# Patient Record
Sex: Male | Born: 1960 | Race: White | Hispanic: No | Marital: Single | Smoking: Current every day smoker
Health system: Southern US, Community
[De-identification: ages and names within clinical notes are randomized; demographics above are authoritative.]

## PROBLEM LIST (undated history)

## (undated) DIAGNOSIS — F1010 Alcohol abuse, uncomplicated: Secondary | ICD-10-CM

## (undated) DIAGNOSIS — R569 Unspecified convulsions: Secondary | ICD-10-CM

## (undated) DIAGNOSIS — S069XAA Unspecified intracranial injury with loss of consciousness status unknown, initial encounter: Secondary | ICD-10-CM

## (undated) DIAGNOSIS — I639 Cerebral infarction, unspecified: Secondary | ICD-10-CM

## (undated) DIAGNOSIS — S069X9A Unspecified intracranial injury with loss of consciousness of unspecified duration, initial encounter: Secondary | ICD-10-CM

## (undated) HISTORY — PX: OTHER SURGICAL HISTORY: SHX169

## (undated) HISTORY — PX: NO PAST SURGERIES: SHX2092

---

## 2007-09-04 LAB — CBC WITH AUTO DIFFERENTIAL
Basophils %: 0 % (ref 0–2)
Basophils, Absolute: 0 10*3/uL (ref 0–0.2)
Eosinophils %: 3 % (ref 0–7)
Eosinophils, Absolute: 0.2 10*3/uL (ref 0–0.7)
HCT: 45.9 % (ref 42.0–54.0)
Hgb: 16 gm/dL (ref 14.0–18.0)
Lymphocytes %: 38 % (ref 25–45)
Lymphocytes, Absolute: 3.3 10*3/uL (ref 1.1–4.3)
MCH: 34.6 pg — ABNORMAL HIGH (ref 27.0–34.0)
MCHC: 34.9 gm/dL (ref 32.0–36.0)
MCV: 99.3 fL — ABNORMAL HIGH (ref 81.0–99.0)
MPV: 7.8 fL (ref 7.4–10.4)
Monocytes %: 11 % (ref 0–12)
Monocytes, Absolute: 0.9 10*3/uL (ref 0–1.2)
Neutrophils, Absolute: 4.1 10*3/uL (ref 1.6–7.3)
Platelet Count: 225 10*3/uL (ref 150–400)
RBC: 4.63 10*6/uL — ABNORMAL LOW (ref 4.70–6.10)
RDW: 13.6 % (ref 11.5–14.5)
Segs (Neutrophils)%: 48 % (ref 35–70)
WBC: 8.5 10*3/uL (ref 4.8–10.8)

## 2007-09-04 LAB — BASIC METABOLIC PANEL
Anion Gap: 13.6 mmol/L (ref 5–19)
BUN: 8 mg/dL (ref 6–26)
CO2 - Carbon Dioxide: 27.4 mmol/L (ref 22–34)
Calcium: 8.7 mg/dL (ref 8.5–10.5)
Chloride: 93 mmol/L — ABNORMAL LOW (ref 99–111)
Creatinine, Serum: 0.8 mg/dL (ref 0.6–1.3)
GFR Estimate: >60 mL/min/{1.73_m2} (ref >60)
Glucose: 91 mg/dL (ref 80–99)
Potassium: 4 mmol/L (ref 3.5–5.3)
Sodium: 130 mmol/L — ABNORMAL LOW (ref 136–148)

## 2007-09-04 LAB — ALCOHOL, SERUM: Alcohol Scrn: 319 mg/dL — ABNORMAL HIGH (ref <5)

## 2010-05-01 LAB — COMPREHENSIVE METABOLIC PANEL
ALT - Alanine Amino transferase: 38 IU/L (ref 13–60)
AST - Aspartate Aminotransferase: 55 IU/L — ABNORMAL HIGH (ref 10–42)
Albumin/Globulin Ratio: 1.1 (ref >0.9)
Albumin: 3.6 gm/dL (ref 3.5–5.0)
Alkaline Phosphatase: 73 IU/L (ref 37–107)
Anion Gap: 14 mmol/L — ABNORMAL HIGH (ref 3–11)
BUN: 8 mg/dL (ref 8–21)
Bilirubin, Total: 0.7 mg/dL (ref 0.2–1.4)
CO2 - Carbon Dioxide: 23 mmol/L (ref 22–31)
Calcium: 9.3 mg/dL (ref 8.6–10.0)
Chloride: 96 mmol/L — ABNORMAL LOW (ref 98–111)
Creatinine, Serum: 1.11 mg/dL (ref 0.64–1.27)
GFR Estimate: >60 mL/min/{1.73_m2} (ref >60)
Globulin: 3.4 gm/dL (ref 2.2–3.7)
Glucose: 122 mg/dL — ABNORMAL HIGH (ref 80–99)
Potassium: 4.1 mmol/L (ref 3.5–5.1)
Protein, Total: 7 gm/dL (ref 6.1–7.9)
Sodium: 133 mmol/L — ABNORMAL LOW (ref 135–143)

## 2010-05-01 LAB — CBC WITH AUTO DIFFERENTIAL
Basophils %: 1 % (ref 0–2)
Basophils, Absolute: 0 10*3/uL (ref 0–0.2)
Eosinophils %: 4 % (ref 0–7)
Eosinophils, Absolute: 0.3 10*3/uL (ref 0–0.7)
HCT: 44.1 % (ref 42.0–54.0)
Hgb: 15.5 gm/dL (ref 12.0–18.0)
Lymphocytes %: 23 % — ABNORMAL LOW (ref 25–45)
Lymphocytes, Absolute: 1.7 10*3/uL (ref 1.1–4.3)
MCH: 35.8 pg — ABNORMAL HIGH (ref 27–34)
MCHC: 35 gm/dL (ref 32–36)
MCV: 102.3 fL — ABNORMAL HIGH (ref 81–99)
MPV: 8.6 fL (ref 7.4–10.4)
Monocytes %: 11 % (ref 0–12)
Monocytes, Absolute: 0.8 10*3/uL (ref 0–1.2)
Neutrophils, Absolute: 4.6 10*3/uL (ref 1.6–7.3)
Platelet Count: 229 10*3/uL (ref 150–400)
RBC: 4.32 10*6/uL — ABNORMAL LOW (ref 4.70–6.10)
RDW: 13.5 % (ref 11.5–14.5)
Segs (Neutrophils)%: 61 % (ref 35–70)
WBC: 7.4 10*3/uL (ref 4.8–10.8)

## 2010-05-01 LAB — LAMOTRIGINE LEVEL: Lamotrigine Level: 4.8 ug/mL (ref 2.5–15.0)

## 2012-02-03 ENCOUNTER — Inpatient Hospital Stay: Payer: MEDICARE

## 2012-02-03 DIAGNOSIS — L02229 Furuncle of trunk, unspecified: Secondary | ICD-10-CM

## 2012-02-03 LAB — WOUND CULTURE, SUPERFICIAL
Gram Stain Result: NONE SEEN
Gram Stain Result: NONE SEEN

## 2013-06-02 ENCOUNTER — Inpatient Hospital Stay: Admit: 2013-06-02 | Discharge: 2013-06-02 | Disposition: A | Discharge status: Home / Self Care | Payer: MEDICARE

## 2013-06-02 DIAGNOSIS — G40909 Epilepsy, unspecified, not intractable, without status epilepticus: Secondary | ICD-10-CM

## 2013-06-02 LAB — CBC WITH AUTO DIFFERENTIAL
Basophils %: 1 % (ref 0–2)
Basophils, Absolute: 0.1 10*3/uL (ref 0–0.2)
Eosinophils %: 1 % (ref 0–7)
Eosinophils, Absolute: 0.1 10*3/uL (ref 0–0.7)
HCT: 47.1 % (ref 42.0–54.0)
Hgb: 16.2 gm/dL (ref 14.0–18.0)
Lymphocytes %: 36 % (ref 25–45)
Lymphocytes, Absolute: 2.8 10*3/uL (ref 1.1–4.3)
MCH: 35.1 pg — ABNORMAL HIGH (ref 27.0–34.0)
MCHC: 34.4 gm/dL (ref 32.0–36.0)
MCV: 102 fL — ABNORMAL HIGH (ref 81.0–99.0)
MPV: 8.7 fL (ref 7.4–10.4)
Monocytes %: 12 % (ref 0–12)
Monocytes, Absolute: 1 10*3/uL (ref 0–1.2)
Neutrophils, Absolute: 4 10*3/uL (ref 1.6–7.3)
Platelet Count: 177 10*3/uL (ref 150–400)
RBC: 4.61 10*6/uL — ABNORMAL LOW (ref 4.70–6.10)
RDW: 13.7 % (ref 11.5–14.5)
Segs (Neutrophils)%: 50 % (ref 35–70)
WBC: 7.9 10*3/uL (ref 4.8–10.8)

## 2013-06-02 LAB — COMPREHENSIVE METABOLIC PANEL
ALT - Alanine Amino transferase: 115 IU/L — ABNORMAL HIGH (ref 5–35)
AST - Aspartate Aminotransferase: 123 IU/L — ABNORMAL HIGH (ref 5–37)
Albumin/Globulin Ratio: 1.2 (ref >0.9)
Albumin: 4.2 gm/dL (ref 3.5–5.5)
Alkaline Phosphatase: 95 IU/L (ref 39–117)
Anion Gap: 23.1 mmol/L — ABNORMAL HIGH (ref 3–11)
BUN: 8 mg/dL (ref 6–26)
Bilirubin, Total: 1.1 mg/dL (ref 0.2–1.2)
CO2 - Carbon Dioxide: 13.9 mmol/L — ABNORMAL LOW (ref 22–34)
Calcium: 9 mg/dL (ref 8.5–10.5)
Chloride: 95 mmol/L — ABNORMAL LOW (ref 99–111)
Creatinine, Serum: 0.89 mg/dL (ref 0.60–1.30)
GFR Estimate: >60 mL/min/{1.73_m2} (ref >60)
Globulin: 3.6 gm/dL (ref 2.2–3.7)
Glucose: 120 mg/dL — ABNORMAL HIGH (ref 80–99)
Potassium: 3.8 mmol/L (ref 3.5–5.3)
Protein, Total: 7.8 gm/dL (ref 6.0–8.0)
Sodium: 132 mmol/L — ABNORMAL LOW (ref 136–148)

## 2013-06-02 LAB — KEPPRA (LEVETIRACETAM): Keppra (Levetiracetam): 12.1 ug/mL (ref 12.0–46.0)

## 2013-06-02 NOTE — ED Notes (Signed)
Bed: 109-01  Expected date:   Expected time:   Means of arrival:   Comments:  40, m, seizure, doug

## 2013-06-02 NOTE — ED Notes (Signed)
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 SHORT FULL TONIC-CLONIC SEIZURE WITNESSED. PT IS A/OX 3 AT HIS TIME.

## 2013-06-02 NOTE — ED Notes (Signed)
Pt states feeling much better at this time, given a large glass of ice water for a PO challenge per Dr. Ranae Pila. Dr. Ranae Pila into room to speak with pt and his wife.

## 2013-06-02 NOTE — ED Notes (Signed)
Pt is a&Ox 4 at this time and states he is ready to go home, pt is being discharged to home with his wife. Pt is refusing a WC out. No pain

## 2013-06-02 NOTE — Discharge Instructions (Signed)
As we discussed you need to consider your driver's license suspended until you are cleared by your primary provider and/or neurologist .  This typically takes several months of no seizures and then the form to be filled for the DMV .        Driving and Equipment Restrictions  Some medical problems make it dangerous to drive, ride a bike, or use machines. Some of these problems are:  ? A hard blow to the head (concussion).  ? Passing out (fainting).  ? Twitching and shaking (seizures).  ? Low blood sugar.  ? Taking medicine to help you relax (sedatives).  ? Taking pain medicines.  ? Wearing an eye patch.  ? Wearing splints. This can make it hard to use parts of your body that you need to drive safely.  HOME CARE   ? Do not drive until your doctor says it is okay.  ? Do not use machines until your doctor says it is okay.  You may need a form signed by your doctor (medical release) before you can drive again. You may also need this form before you do other tasks where you need to be fully alert.  MAKE SURE YOU:  ? Understand these instructions.  ? Will watch your condition.  ? Will get help right away if you are not doing well or get worse.  Document Released: 01/30/2004 Document Revised: 03/16/2011 Document Reviewed: 05/01/2009  ExitCare? Patient Information ?2015 [employer]. This information is not intended to replace advice given to you by your health care provider. Make sure you discuss any questions you have with your health care provider.    Seizure, Adult  A seizure is abnormal electrical activity in the brain. Seizures usually last from 30 seconds to 2 minutes. There are various types of seizures.  Before a seizure, you may have a warning sensation (aura) that a seizure is about to occur. An aura may include the following symptoms:   ? Fear or anxiety.  ? Nausea.  ? Feeling like the room is spinning (vertigo).  ? Vision changes, such as seeing flashing lights or spots.  Common symptoms during a seizure  include:  ? A change in attention or behavior (altered mental status).  ? Convulsions with rhythmic jerking movements.  ? Drooling.  ? Rapid eye movements.  ? Grunting.  ? Loss of bladder and bowel control.  ? Bitter taste in the mouth.  ? Tongue biting.  After a seizure, you may feel confused and sleepy. You may also have an injury resulting from convulsions during the seizure.  HOME CARE INSTRUCTIONS   ? If you are given medicines, take them exactly as prescribed by your health care provider.  ? Keep all follow-up appointments as directed by your health care provider.  ? Do not swim or drive or engage in risky activity during which a seizure could cause further injury to you or others until your health care provider says it is OK.  ? Get adequate rest.  ? Teach friends and family what to do if you have a seizure. They should:  ? Lay you on the ground to prevent a fall.  ? Put a cushion under your head.  ? Loosen any tight clothing around your neck.  ? Turn you on your side. If vomiting occurs, this helps keep your airway clear.  ? Stay with you until you recover.  ? Know whether or not you need emergency care.  SEEK IMMEDIATE MEDICAL CARE IF:  ?  The seizure lasts longer than 5 minutes.  ? The seizure is severe or you do not wake up immediately after the seizure.  ? You have an altered mental status after the seizure.  ? You are having more frequent or worsening seizures.  Someone should drive you to the emergency department or call local emergency services (911 in [place]  MAKE SURE YOU:  ? Understand these instructions.  ? Will watch your condition.  ? Will get help right away if you are not doing well or get worse.  Document Released: 12/20/1999 Document Revised: 10/12/2012 Document Reviewed: 08/03/2012  ExitCare? Patient Information ?2015 [employer]. This information is not intended to replace advice given to you by your health care provider. Make sure you discuss any questions you have with your health care  provider.

## 2013-06-02 NOTE — ED Provider Notes (Signed)
History   Scott Dudley is a 53 y.o. year old male presenting with Seizures  .    HPI patient is 53 years old with a long history of seizures he was on Dilantin for many years and over last several years has been on Keppra he is taking the medication he doesn't feel that he's missed his dose.  According to patient and his wife he does still have his driver's license.  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  Denies recent headache he has had mildly low sodium in the past and denies any other acute medical problems he was at his baseline prior to this change    Past Medical History   Diagnosis Date   . Seizures    . Hypertension        Past Surgical History   Procedure Laterality Date   . Knee surgery         No family history on file.    History   Substance Use Topics   . Smoking status: Current Every Day Smoker -- 0.50 packs/day     Types: Cigarettes   . Smokeless tobacco: Not on file   . Alcohol Use: Yes     Other Social History Comments:       Discharge Medication List as of 06/02/2013  1:39 PM      CONTINUE these medications which have NOT CHANGED    Details   escitalopram oxalate (LEXAPRO) 10 MG tablet Take 10 mg by mouth daily., Until Discontinued, Historical Med      levETIRAcetam (KEPPRA) 500 MG tablet Take 500 mg by mouth 2 (two) times daily., Until Discontinued, Historical Med      lisinopril (PRINIVIL,ZESTRIL) 10 MG tablet Take 10 mg by mouth daily., Until Discontinued, Historical Med             Review of Systems full review without any acute abnormalities as above    Physical Exam   BP 143/78  Pulse 96  Temp(Src) 37.1 ?C (98.7 ?F) (Oral)  Resp 18  SpO2 96%    Physical Exam alert oriented very pleasant man in no signs of head injury  Neck is  supple and moves well there is no stiffness  Face symmetric eyes are normal  Neurologically intact moving all his extremities no signs of focal weakness  Lungs are clear  Cardiovascular regular rate and rhythm\  Skin is normal    ED Course     Results for orders placed during the hospital encounter of 06/02/13 (from the past 24 hour(s))   COMPREHENSIVE METABOLIC PANEL    Collection Time     06/02/13 12:06 PM       Result Value Ref Range    Sodium 132 (*) 136 - 148 mmol/L    Potassium 3.8  3.5 - 5.3 mmol/L    Chloride 95 (*) 99 - 111 mmol/L    CO2 - Carbon Dioxide 13.9 (*) 22 - 34 mmol/L    Glucose 120 (*) 80 - 99 mg/dL    BUN 8  6 - 26 mg/dL    Creatinine, Serum 1.61  0.60 - 1.30 mg/dL    Calcium 9.0  8.5 - 09.6 mg/dL  AST - Aspartate Aminotransferase 123 (*) 5 - 37 IU/L    ALT - Alanine Amino transferase 115 (*) 5 - 35 IU/L    Alkaline Phosphatase 95  39 - 117 IU/L    Bilirubin, Total 1.1  0.2 - 1.2 mg/dL    Protein, Total 7.8  6.0 - 8.0 gm/dL    Albumin 4.2  3.5 - 5.5 gm/dL    Globulin 3.6  2.2 - 3.7 gm/dL    Albumin/Globulin Ratio 1.2  >0.9    Anion Gap 23.1 (*) 3 - 11 mmol/L    GFR Estimate >60  >60 mL/min/1.73sq.m    GFR Additional Info        Value: For African Americans, multiply EGFR x 1.21.  See [email or web address]   CBC WITH AUTO DIFFERENTIAL    Collection Time     06/02/13 12:06 PM       Result Value Ref Range    WBC 7.9  4.8 - 10.8 K/microL    RBC 4.61 (*) 4.70 - 6.10 M/microL    Hgb 16.2  14.0 - 18.0 gm/dL    HCT 96.0  45.4 - 09.8 %    MCV 102.0 (*) 81.0 - 99.0 fL    MCH 35.1 (*) 27.0 - 34.0 pg    MCHC 34.4  32.0 - 36.0 gm/dL    RDW 11.9  14.7 - 82.9 %    Platelet Count 177  150 - 400 K/microL    MPV 8.7  7.4 - 10.4 fL    Differential Type AUTO      Neutrophils % 50  35 - 70 %    Lymphocytes % 36  25 - 45 %    Monocytes % 12  0 - 12 %    Eosinophils % 1  0 - 7 %    Basophils % 1  0 - 2 %    Neutrophils, Absolute 4.0  1.6 - 7.3 K/microL    Lymphocytes, Absolute 2.8  1.1 - 4.3 K/microL    Monocytes,  Absolute 1.0  0 - 1.2 K/microL    Eosinophils, Absolute 0.1  0 - 0.7 K/microL    Basophils, Absolute 0.1  0 - 0.2 K/microL       No orders to display       Procedures    MDM patient alert and oriented very appropriate clearly has had a seizure.  Labs are without significant abnormalities no significant hyponatremia.  I discussed with the patient and his wife that he should consider his driver's license suspended and will ask his primary provider to follow that up DMV form is filled.  Patient and wife understand that he will need full clearance and reinstatement of his slices through the [facility] prior to his driving and they are actually quite aware that that is appropriate.  He will continue to take his Keppra they know that the level is pending because it is a send out they will check with their primary provider [name] to make sure that the Keppra is appropriately dosed will follow closely with her neurologist.    ED Disposition: Discharge    Diagnoses that have been ruled out:   None   Diagnoses that are still under consideration:   None   Final diagnoses:   Seizure disorder       Kandice Robinsons, MD  06/02/13 1521

## 2013-06-02 NOTE — ED Notes (Signed)
Pt's wife and pt states he is ready to go home, they state they have been through this numerous times and they fell like he id ready to go home. Dr. Ranae Pila notified.

## 2013-12-05 DIAGNOSIS — I639 Cerebral infarction, unspecified: Secondary | ICD-10-CM

## 2013-12-05 HISTORY — DX: Cerebral infarction, unspecified: I63.9

## 2013-12-22 ENCOUNTER — Emergency Department: Admit: 2013-12-23 | Payer: MEDICARE

## 2013-12-22 DIAGNOSIS — G8194 Hemiplegia, unspecified affecting left nondominant side: Secondary | ICD-10-CM

## 2013-12-22 NOTE — Progress Notes (Signed)
Spouse called to inform staff that pt consumes approximately half of a half gallon to a half gallon of liquor daily and states that he does not like to admit how much he drinks so she wanted to make sure we were aware to monitor him properly. CIWA ordered.

## 2013-12-22 NOTE — ED Notes (Signed)
MD in speaking with pt and pt's family, no distress noted.

## 2013-12-22 NOTE — Progress Notes (Signed)
Pt had approximately 30 second Clonic Tonic Seizure.

## 2013-12-22 NOTE — ED Notes (Signed)
RN at bedside.  No change in patient condition.  Vital signs stable.  Patient aware of plan of care.  Bed in lowest position and call light within reach. Wife at bedside

## 2013-12-22 NOTE — ED Notes (Addendum)
Assumed care of Pt.  Pt repositioned in bed, Pt with noted left gaze, nystagmus, and visual cut, however he follows all commands and answers questions appropriately.  Pt also with noted tremors over his whole body.  Pt updated on plan of care, no signs of distress, will monitor.

## 2013-12-22 NOTE — H&P (Signed)
History and Physical      Name: Scott Dudley  DOB: 19-Aug-1960 53 y.o.  MRN: [id number]     CSN: [id number]    History:     Chief Complaint:  Seizures    History of Present Illness:  Scott Dudley is a 53 y.o. male who presents for evaluation of left hemiparesis. Patient presented to the emergency room following  a seizure. In the emergency room he was to have a left hemi Neglect and the left partial paralysis. He does have a history of seizures. He also drinks daily. Is alcohol level was elevated on admission. He denies any recent fevers, chills, or illnesses. Is that drinking alcohol the day. He denies any pain. He has a history of a cerebral vascular accident.  Past Medical History:  Past Medical History   Diagnosis Date   . Seizures    . Hypertension    . Acute left hemiparesis 12/22/2013     Past Surgical History:  Past Surgical History   Procedure Laterality Date   . Knee surgery       Current Medications:  Prior to Admission Medications    Medication Dose & Frequency   escitalopram oxalate (LEXAPRO) 10 MG tablet Take 10 mg by mouth daily.   levETIRAcetam (KEPPRA) 500 MG tablet Take 500 mg by mouth 3 (three) times daily.    lisinopril (PRINIVIL,ZESTRIL) 10 MG tablet Take 10 mg by mouth daily.        Allergies:  Review of patient's allergies indicates no known allergies.    Family History:  History reviewed. No pertinent family history.  Social History:  History   Substance Use Topics   . Smoking status: Current Every Day Smoker -- 0.50 packs/day     Types: Cigarettes   . Smokeless tobacco: Never Used   . Alcohol Use: 16.8 oz/week     28 Shots of liquor per week     Immunization:    There is no immunization history on file for this patient.  Review of Systems:  Review of Systems   Constitutional: Negative for fever, chills, weight loss, malaise/fatigue and diaphoresis.   HENT: Negative.    Eyes: Negative.    Respiratory: Negative.    Cardiovascular: Negative.    Gastrointestinal: Negative.    Genitourinary:  Negative.    Musculoskeletal: Negative.    Skin: Negative.    Neurological: Positive for seizures. Negative for weakness.   Endo/Heme/Allergies: Negative.    Psychiatric/Behavioral: Negative.        Physical Exam:     Vital Signs:  Initial Vitals   BP 12/22/13 1727 182/105 mmHg   Heart Rate 12/22/13 1727 86   Resp 12/22/13 1727 20   Temp 12/22/13 1727 37 ?C (98.6 ?F)   SpO2 12/22/13 1727 96 %     Exam:  Physical Exam   Constitutional: He appears well-developed and well-nourished. No distress.   HENT:   Head: Atraumatic.   Right Ear: External ear normal.   Left Ear: External ear normal.   Nose: Nose normal.   Mouth/Throat: Oropharynx is clear and moist.   His eyes are deviate to the left. He has a left facial paralysis.   Eyes: Pupils are equal, round, and reactive to light.   Neck: Normal range of motion. Neck supple.   Cardiovascular: Normal rate, regular rhythm and normal heart sounds.  Exam reveals no gallop and no friction rub.    No murmur heard.  Pulmonary/Chest: Effort normal and breath sounds normal.  No respiratory distress. He has no wheezes. He has no rales. He exhibits no tenderness.   Abdominal: Soft. He exhibits no distension. There is no tenderness.   Genitourinary: Penis normal.   Musculoskeletal: He exhibits no edema or tenderness.   Neurological: A cranial nerve deficit is present.   He has a left facial paralysis. His left arm is weak but he is able to move it. he moves his right arm when asked his left hand. His left leg is weak as well. He denies numbness or tingling of his arms or legs.   Skin: Skin is warm and dry. No rash noted. He is not diaphoretic. No erythema.       Data:     Labs:  Results for orders placed or performed during the hospital encounter of 12/22/13 (from the past 24 hour(s))   Comprehensive Metabolic Panel -STAT    Collection Time: 12/22/13  5:16 PM   Result Value Ref Range    Sodium 126 (L) 135 - 143 mmol/L    Potassium 4.1 3.5 - 5.1 mmol/L    Chloride 92 (L) 98 - 111 mmol/L     CO2 - Carbon Dioxide 22.0 21.0 - 31.0 mmol/L    Glucose 82 80 - 99 mg/dL    BUN 8 6.0 - 04.5 mg/dL    Creatinine, Serum 4.09 (L) 0.64 - 1.27 mg/dL    Calcium 9.3 8.6 - 81.1 mg/dL    AST - Aspartate Aminotransferase 88 (H) 10 - 50 IU/L    ALT - Alanine Amino transferase 97 (H) 7 - 52 IU/L    Alkaline Phosphatase 98 34 - 104 IU/L    Bilirubin, Total 1.1 0.3 - 1.2 mg/dL    Protein, Total 7.5 6.0 - 8.0 gm/dL    Albumin 4.5 3.5 - 5.0 gm/dL    Globulin 3.0 2.2 - 3.7 gm/dL    Albumin/Globulin Ratio 1.5 >0.9    Anion Gap 12.0 (H) 3 - 11 mmol/L    GFR Estimate >60 >60 mL/min/1.73sq.m    GFR Additional Info                                                    CBC with Auto Differential -STAT    Collection Time: 12/22/13  5:16 PM   Result Value Ref Range    WBC 7.1 4.8 - 10.8 K/microL    RBC 4.67 (L) 4.70 - 6.10 M/microL    Hgb 16.3 14.0 - 18.0 gm/dL    HCT 91.4 78.2 - 95.6 %    MCV 100.9 (H) 81.0 - 99.0 fL    MCH 34.8 (H) 27.0 - 34.0 pg    MCHC 34.5 32.0 - 36.0 gm/dL    RDW 21.3 08.6 - 57.8 %    Platelet Count 153 150 - 400 K/microL    MPV 8.7 7.4 - 10.4 fL    Differential Type AUTO     Segs (Neutrophils)% 72 (H) 35 - 70 %    Lymphocytes % 17 (L) 25 - 45 %    Monocytes % 11 0 - 12 %    Eosinophils % 0 0 - 7 %    Basophils % 0 0 - 2 %    Segs (Neutrophils), Absolute 5.1 1.6 - 7.3 K/microL    Lymphocytes, Absolute 1.2 1.1 - 4.3 K/microL  Monocytes, Absolute 0.8 0 - 1.2 K/microL    Eosinophils, Absolute 0.0 0 - 0.7 K/microL    Basophils, Absolute 0.0 0 - 0.2 K/microL   PT & PTT -STAT    Collection Time: 12/22/13  5:16 PM   Result Value Ref Range    Protime 10.6 9.9 - 13.4 sec    INR 0.9 0.9 - 1.1    APTT 34 23 - 36 sec   Troponin-I -STAT    Collection Time: 12/22/13  5:16 PM   Result Value Ref Range    Troponin I <0.03 0.00 - 0.04 ng/mL   Alcohol Medical/Serum -STAT    Collection Time: 12/22/13  5:16 PM   Result Value Ref Range    Alcohol Scrn 111 (H) <10 mg/dL          Imaging for last 24 hours:  X-ray Chest Pa Or  Ap    12/22/2013   IMPRESSION: Normal portable chest examination.     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  No acute mass effect or hemorrhage noted in the brain.  Acute ischemia not seen      Special Studies        Assessment:   He is having a new onset left-sided hemi neglect following a seizure. Todd's   paralysis  is a likely possibility. CT showed no infarct but nothing acute. He also had alcohol use which is complicating his situation. Watch for withdrawal symptoms.       Diagnoses:  Active Hospital Problems    Diagnosis SNOMED CT(R) Date Noted   . Simple febrile convulsions FEBRILE CONVULSION 12/22/2013   . Seizure disorder SEIZURE DISORDER 12/22/2013   . Hypertension, benign BENIGN HYPERTENSION 12/22/2013   . Acute left hemiparesis LEFT HEMIPARESIS 12/22/2013   . Acute alcohol intoxication ALCOHOL INTOXICATION 12/22/2013   . CVA (cerebral infarction) CEREBRAL INFARCTION 12/22/2013   . Left hemiparesis LEFT HEMIPARESIS 12/22/2013       Plan:   Admit  For evaluation and treatment. Will get him to up ambulating early. You should not need DVT prophylaxis due to early emulation. He may only one patient if this results by tomorrow. It should Resolve if this is a Todd's paralysis .  Portions of this medical record were created using voice recognition software.        Jesusita Oka [facility]  12/22/2013  7:33 PM

## 2013-12-22 NOTE — ED Notes (Signed)
Pt changed into gown with BP cuff, pulse ox and tele leads put back on.

## 2013-12-22 NOTE — ED Notes (Addendum)
Pt attempted to void- could not at this time. Wife at bedside. States she believes pt is having a seizure. She states his seizures in the past have looked like this. Pt remains awake and alert. Moving all extremities, answering questions appropriately. Given water- no swallowing difficulty. VSS. NSR on monitor. Given warm blanket and assisted to position of comfort.

## 2013-12-22 NOTE — ED Notes (Signed)
Spoke to Pt's wife who would like [place] to call her with room assignment at phone number - [phone].

## 2013-12-22 NOTE — ED Notes (Addendum)
Pt states he may have had a seizure earlier today. He has left arm numbness and tingling. Did have blurred vision earlier- this is resolved. Pt has hx of seizure disorder and states this episode felt the same. Took an extra Keppra. Pt is A&O x4 . Moves all extremities. No weakness. No headache. Airway patent. Nystagmus noted. Pt smells of alcohol. Reports having 7 ounces alcohol daily, and a few sips today.

## 2013-12-22 NOTE — Progress Notes (Signed)
Bed alarm went off, staff went into room and pt was starting to fall out of bed head first, staff assisted pt to floor. No injuries noted. Fall mats placed at both sides of bed.

## 2013-12-22 NOTE — ED Notes (Signed)
Called lab to add-on a KEPPRA level. Lab aware.

## 2013-12-22 NOTE — ED Notes (Signed)
Seizure pads placed on gurney.

## 2013-12-22 NOTE — ED Notes (Signed)
Transport at bedside to take Pt to floor for admission.

## 2013-12-22 NOTE — ED Notes (Signed)
Bed: 110-01  Expected date: 12/22/13  Expected time: 5:08 PM  Means of arrival: Ambulance  Comments:  Sue Lush RN 52 m poss stroke, seizure

## 2013-12-23 ENCOUNTER — Inpatient Hospital Stay: Admit: 2013-12-23 | Payer: MEDICARE

## 2013-12-23 ENCOUNTER — Inpatient Hospital Stay
Admission: EM | Admit: 2013-12-23 | Discharge: 2013-12-28 | Disposition: A | Discharge status: Skilled Nursing Facility | Payer: MEDICARE | Admitting: Family

## 2013-12-23 LAB — CBC WITH AUTO DIFFERENTIAL
Basophils %: 0 % (ref 0–2)
Basophils, Absolute: 0 10*3/uL (ref 0–0.2)
Eosinophils %: 0 % (ref 0–7)
Eosinophils, Absolute: 0 10*3/uL (ref 0–0.7)
HCT: 47.2 % (ref 42.0–54.0)
HCT: 45.9 % (ref 42.0–54.0)
Hgb: 16.3 gm/dL (ref 14.0–18.0)
Hgb: 15.9 gm/dL (ref 14.0–18.0)
Lymphocytes %: 17 % — ABNORMAL LOW (ref 25–45)
Lymphocytes %: 27 % (ref 25–45)
Lymphocytes, Absolute: 1.2 10*3/uL (ref 1.1–4.3)
Lymphocytes, Absolute: 2.03 10*3/uL (ref 1.10–4.30)
MCH: 34.8 pg — ABNORMAL HIGH (ref 27.0–34.0)
MCH: 35 pg — ABNORMAL HIGH (ref 27.0–34.0)
MCHC: 34.5 gm/dL (ref 32.0–36.0)
MCHC: 34.7 gm/dL (ref 32.0–36.0)
MCV: 100.9 fL — ABNORMAL HIGH (ref 81.0–99.0)
MCV: 101.1 fL — ABNORMAL HIGH (ref 81.0–99.0)
MPV: 8.7 fL (ref 7.4–10.4)
MPV: 9.1 fL (ref 7.4–10.4)
Monocytes %: 11 % (ref 0–12)
Monocytes %: 12 % (ref 0–12)
Monocytes, Absolute: 0.8 10*3/uL (ref 0–1.2)
Monocytes, Absolute: 0.9 10*3/uL (ref 0–1.20)
Neutrophils, Absolute: 5.1 10*3/uL (ref 1.6–7.3)
Platelet Count: 153 10*3/uL (ref 150–400)
Platelet Count: 140 10*3/uL — ABNORMAL LOW (ref 150–400)
RBC: 4.67 10*6/uL — ABNORMAL LOW (ref 4.70–6.10)
RBC: 4.54 10*6/uL — ABNORMAL LOW (ref 4.70–6.10)
RDW: 13.6 % (ref 11.5–14.5)
RDW: 14 % (ref 11.5–14.5)
Segs %: 61 % (ref 35–70)
Segs (Neutrophils)%: 72 % — ABNORMAL HIGH (ref 35–70)
Segs, Absolute: 4.57 10*3/uL (ref 1.60–7.30)
WBC: 7.1 10*3/uL (ref 4.8–10.8)
WBC: 7.5 10*3/uL (ref 4.8–10.8)

## 2013-12-23 LAB — COMPREHENSIVE METABOLIC PANEL
ALT - Alanine Amino transferase: 97 IU/L — ABNORMAL HIGH (ref 7–52)
ALT - Alanine Amino transferase: 86 IU/L — ABNORMAL HIGH (ref 7–52)
AST - Aspartate Aminotransferase: 88 IU/L — ABNORMAL HIGH (ref 10–50)
AST - Aspartate Aminotransferase: 74 IU/L — ABNORMAL HIGH (ref 10–50)
Albumin/Globulin Ratio: 1.5 (ref >0.9)
Albumin/Globulin Ratio: 1.5 (ref >0.9)
Albumin: 4.5 gm/dL (ref 3.5–5.0)
Albumin: 4.3 gm/dL (ref 3.5–5.0)
Alkaline Phosphatase: 98 IU/L (ref 34–104)
Alkaline Phosphatase: 85 IU/L (ref 34–104)
Anion Gap: 12 mmol/L — ABNORMAL HIGH (ref 3–11)
Anion Gap: 8 mmol/L (ref 3–11)
BUN: 8 mg/dL (ref 6.0–23.0)
BUN: 8 mg/dL (ref 6.0–23.0)
Bilirubin, Total: 1.1 mg/dL (ref 0.3–1.2)
Bilirubin, Total: 1.5 mg/dL — ABNORMAL HIGH (ref 0.3–1.2)
CO2 - Carbon Dioxide: 22 mmol/L (ref 21.0–31.0)
CO2 - Carbon Dioxide: 26 mmol/L (ref 21.0–31.0)
Calcium: 9.3 mg/dL (ref 8.6–10.3)
Calcium: 9 mg/dL (ref 8.6–10.3)
Chloride: 92 mmol/L — ABNORMAL LOW (ref 98–111)
Chloride: 93 mmol/L — ABNORMAL LOW (ref 98–111)
Creatinine, Serum: 0.63 mg/dL — ABNORMAL LOW (ref 0.64–1.27)
Creatinine, Serum: 0.76 mg/dL (ref 0.64–1.27)
GFR Estimate: >60 mL/min/{1.73_m2} (ref >60)
GFR Estimate: >60 mL/min/{1.73_m2} (ref >60)
Globulin: 3 gm/dL (ref 2.2–3.7)
Globulin: 2.9 gm/dL (ref 2.2–3.7)
Glucose: 82 mg/dL (ref 80–99)
Glucose: 97 mg/dL (ref 80–99)
Potassium: 4.1 mmol/L (ref 3.5–5.1)
Potassium: 4.3 mmol/L (ref 3.5–5.1)
Protein, Total: 7.5 gm/dL (ref 6.0–8.0)
Protein, Total: 7.2 gm/dL (ref 6.0–8.0)
Sodium: 126 mmol/L — ABNORMAL LOW (ref 135–143)
Sodium: 127 mmol/L — ABNORMAL LOW (ref 135–143)

## 2013-12-23 LAB — PT & PTT
APTT: 34 s (ref 23–36)
INR: 0.9 (ref 0.9–1.1)
Protime: 10.6 s (ref 9.9–13.4)

## 2013-12-23 LAB — ALCOHOL, SERUM: Alcohol Scrn: 111 mg/dL — ABNORMAL HIGH (ref <10)

## 2013-12-23 LAB — MAGNESIUM
Magnesium: 1.9 mg/dL (ref 1.6–2.4)
Magnesium: 1.9 mg/dL (ref 1.6–2.4)

## 2013-12-23 LAB — KEPPRA (LEVETIRACETAM): Keppra (Levetiracetam): 69.3 ug/mL — ABNORMAL HIGH (ref 12.0–46.0)

## 2013-12-23 LAB — TROPONIN I: Troponin I: <0.03 ng/mL (ref 0.00–0.04)

## 2013-12-23 MED ORDER — chlordiazePOXIDE (LIBRIUM) capsule 10 mg
5 | ORAL | Status: DC
Start: 2013-12-23 — End: 2013-12-23
  Administered 2013-12-23: 08:00:00 5 mg via ORAL

## 2013-12-23 MED ORDER — diazepam (VALIUM) syringe 5-10 mg
5 | INTRAMUSCULAR | Status: DC | PRN
Start: 2013-12-23 — End: 2013-12-23
  Administered 2013-12-23: 22:00:00 5 mg via INTRAVENOUS

## 2013-12-23 MED ORDER — lisinopril (PRINIVIL,ZESTRIL) tablet 10 mg
10 | Freq: Every day | ORAL | Status: DC
Start: 2013-12-23 — End: 2013-12-27
  Administered 2013-12-23 – 2013-12-27 (×6): 10 mg via ORAL

## 2013-12-23 MED ORDER — chlordiazePOXIDE (LIBRIUM) capsule 25 mg
25 | ORAL | Status: DC
Start: 2013-12-23 — End: 2013-12-23

## 2013-12-23 MED ORDER — diazepam (VALIUM) syringe 5 mg
5 | Freq: Once | INTRAMUSCULAR | Status: AC | PRN
Start: 2013-12-23 — End: 2013-12-22
  Administered 2013-12-23: 04:00:00 5 mg via INTRAVENOUS

## 2013-12-23 MED ORDER — diazepam (VALIUM) tablet 2.5-5 mg
5 | ORAL | Status: DC | PRN
Start: 2013-12-23 — End: 2013-12-23

## 2013-12-23 MED ORDER — aspirin 81 MG chewable tablet 324 mg
81 | Freq: Every day | ORAL | Status: DC
Start: 2013-12-23 — End: 2013-12-28
  Administered 2013-12-23 – 2013-12-28 (×6): 81 mg via ORAL

## 2013-12-23 MED ORDER — haloperidol (HALDOL) tablet 5 mg
5 | Freq: Four times a day (QID) | ORAL | Status: DC | PRN
Start: 2013-12-23 — End: 2013-12-27
  Administered 2013-12-23 – 2013-12-25 (×3): 5 mg via ORAL

## 2013-12-23 MED ORDER — atorvastatin (LIPITOR) tablet 40 mg
40 | Freq: Every evening | ORAL | Status: DC
Start: 2013-12-23 — End: 2013-12-28
  Administered 2013-12-24 – 2013-12-28 (×5): 40 mg via ORAL

## 2013-12-23 MED ORDER — haloperidol (HALDOL) tablet 5 mg
5 | Freq: Once | ORAL | Status: AC
Start: 2013-12-23 — End: 2013-12-23
  Administered 2013-12-23: 15:00:00 5 mg via ORAL

## 2013-12-23 MED ORDER — polyethylene glycol (MIRALAX) packet 17 g
17 | Freq: Every day | ORAL | Status: DC | PRN
Start: 2013-12-23 — End: 2013-12-28

## 2013-12-23 MED ORDER — diazepam (VALIUM) tablet 5-10 mg
5 | ORAL | Status: DC | PRN
Start: 2013-12-23 — End: 2013-12-23
  Administered 2013-12-23: 19:00:00 5 mg via ORAL

## 2013-12-23 MED ORDER — magnesium hydroxide (MILK OF MAGNESIA) 400 mg/5 mL oral suspension 30 mL
400 | Freq: Two times a day (BID) | ORAL | Status: DC | PRN
Start: 2013-12-23 — End: 2013-12-28

## 2013-12-23 MED ORDER — dextrose 5 % and 0.9 % NaCl 1,000 mL with potassium phosphate 20 mmol, magnesium sulfate 1 g infusion
3 | INTRAVENOUS | Status: DC
Start: 2013-12-23 — End: 2013-12-25
  Administered 2013-12-23 – 2013-12-26 (×9): via INTRAVENOUS

## 2013-12-23 MED ORDER — ondansetron (ZOFRAN) injection 4 mg
4 | Freq: Four times a day (QID) | INTRAMUSCULAR | Status: DC | PRN
Start: 2013-12-23 — End: 2013-12-28

## 2013-12-23 MED ORDER — acetaminophen (TYLENOL) tablet 650 mg
325 | ORAL | Status: DC | PRN
Start: 2013-12-23 — End: 2013-12-28

## 2013-12-23 MED ORDER — LORazepam (ATIVAN) tablet 1-2 mg
1 | ORAL | Status: DC | PRN
Start: 2013-12-23 — End: 2013-12-28
  Administered 2013-12-24 – 2013-12-28 (×7): 1 mg via ORAL

## 2013-12-23 MED ORDER — haloperidol lactate (HALDOL) injection 2 mg
5 | Freq: Once | INTRAMUSCULAR | Status: DC
Start: 2013-12-23 — End: 2013-12-28

## 2013-12-23 MED ORDER — chlordiazePOXIDE (LIBRIUM) capsule 25 mg
25 | ORAL | Status: DC
Start: 2013-12-23 — End: 2013-12-26
  Administered 2013-12-23 – 2013-12-26 (×20): 25 mg via ORAL

## 2013-12-23 MED ORDER — escitalopram oxalate (LEXAPRO) tablet 10 mg
10 | Freq: Every day | ORAL | Status: DC
Start: 2013-12-23 — End: 2013-12-28
  Administered 2013-12-23 – 2013-12-28 (×7): 10 mg via ORAL

## 2013-12-23 MED ORDER — LORazepam (ATIVAN) injection 1-2 mg
2 | INTRAMUSCULAR | Status: DC | PRN
Start: 2013-12-23 — End: 2013-12-27
  Administered 2013-12-24 – 2013-12-26 (×10): 2 mg via INTRAVENOUS

## 2013-12-23 MED ORDER — zolpidem (AMBIEN) tablet 5 mg
5 | Freq: Every evening | ORAL | Status: DC | PRN
Start: 2013-12-23 — End: 2013-12-25
  Administered 2013-12-23: 06:00:00 5 mg via ORAL

## 2013-12-23 MED ORDER — levETIRAcetam (KEPPRA) tablet 500 mg
500 | Freq: Three times a day (TID) | ORAL | Status: DC
Start: 2013-12-23 — End: 2013-12-28
  Administered 2013-12-23 – 2013-12-28 (×18): 500 mg via ORAL

## 2013-12-23 MED ORDER — chlordiazePOXIDE (LIBRIUM) capsule 25 mg
25 | Freq: Once | ORAL | Status: AC
Start: 2013-12-23 — End: 2013-12-23
  Administered 2013-12-23: 15:00:00 25 mg via ORAL

## 2013-12-23 MED ORDER — hydrALAZINE (APRESOLINE) injection 10 mg
20 | INTRAMUSCULAR | Status: DC | PRN
Start: 2013-12-23 — End: 2013-12-28
  Administered 2013-12-24 – 2013-12-25 (×4): 20 mg via INTRAVENOUS

## 2013-12-23 MED ORDER — diazepam (VALIUM) syringe 2.5-5 mg
5 | INTRAMUSCULAR | Status: DC | PRN
Start: 2013-12-23 — End: 2013-12-23

## 2013-12-23 MED ORDER — LORazepam (ATIVAN) injection 0.5-1 mg
2 | INTRAMUSCULAR | Status: DC | PRN
Start: 2013-12-23 — End: 2013-12-27
  Administered 2013-12-23 – 2013-12-25 (×4): 2 mg via INTRAVENOUS

## 2013-12-23 MED ORDER — LORazepam (ATIVAN) tablet 0.5-1 mg
0.5 | ORAL | Status: DC | PRN
Start: 2013-12-23 — End: 2013-12-28
  Administered 2013-12-24 (×2): 0.5 mg via ORAL

## 2013-12-23 MED ORDER — chlordiazePOXIDE (LIBRIUM) capsule 5 mg
5 | Freq: Four times a day (QID) | ORAL | Status: DC | PRN
Start: 2013-12-23 — End: 2013-12-22
  Administered 2013-12-23: 04:00:00 5 mg via ORAL

## 2013-12-23 MED FILL — DEXTROSE 5 % AND 0.9 % SODIUM CHLORIDE INTRAVENOUS SOLUTION: INTRAVENOUS | Qty: 1000

## 2013-12-23 MED FILL — CHLORDIAZEPOXIDE 5 MG CAPSULE: 5 mg | ORAL | Qty: 2

## 2013-12-23 MED FILL — DIAZEPAM 5 MG/ML INJECTION SYRINGE: 5 mg/mL | INTRAMUSCULAR | Qty: 2

## 2013-12-23 MED FILL — ASPIRIN 81 MG CHEWABLE TABLET: 81 mg | ORAL | Qty: 4

## 2013-12-23 MED FILL — HALOPERIDOL 5 MG TABLET: 5 mg | ORAL | Qty: 1

## 2013-12-23 MED FILL — LEVETIRACETAM 500 MG TABLET: 500 mg | ORAL | Qty: 1

## 2013-12-23 MED FILL — ESCITALOPRAM 10 MG TABLET: 10 mg | ORAL | Qty: 1

## 2013-12-23 MED FILL — CHLORDIAZEPOXIDE 25 MG CAPSULE: 25 mg | ORAL | Qty: 1

## 2013-12-23 MED FILL — ZOLPIDEM 5 MG TABLET: 5 mg | ORAL | Qty: 1

## 2013-12-23 MED FILL — LISINOPRIL 10 MG TABLET: 10 mg | ORAL | Qty: 1

## 2013-12-23 MED FILL — LORAZEPAM 2 MG/ML INJECTION SOLUTION: 2 mg/mL | INTRAMUSCULAR | Qty: 1

## 2013-12-23 MED FILL — CHLORDIAZEPOXIDE 5 MG CAPSULE: 5 mg | ORAL | Qty: 1

## 2013-12-23 MED FILL — DIAZEPAM 5 MG TABLET: 5 mg | ORAL | Qty: 1

## 2013-12-23 NOTE — Progress Notes (Signed)
Daily Progress Note     Name: Scott Dudley  DOB: 1960/01/15 53 y.o.  MRN: [id number]  CSN: [id number]    Assessment & Plan:     Active Hospital Problems    Diagnosis SNOMED CT(R) Date Noted   . Simple febrile convulsions FEBRILE CONVULSION 12/22/2013   . Seizure disorder SEIZURE DISORDER 12/22/2013   . Hypertension, benign BENIGN HYPERTENSION 12/22/2013   . Acute left hemiparesis LEFT HEMIPARESIS 12/22/2013   . Acute alcohol intoxication ALCOHOL INTOXICATION 12/22/2013   . CVA (cerebral infarction) CEREBRAL INFARCTION 12/22/2013   . Left hemiparesis LEFT HEMIPARESIS 12/22/2013     He continues to have left hemiparesis. This seems unusual for a todd's paralysis - but no unheard off.  Still, i am quite worried about a stroke. Start asa, statin, allow Bp to be slightly on higher side.  Carotid u/s negative for blockages.  Get MRI brain.  He is confused and tremulous - start bzd prn on ciwa protocol.    Subjective:     Scott Dudley is a 53 y.o. male patient. Patient was admitted with Acute left hemiparesis  and has had a stable course since that time.     Scheduled Medications:   . aspirin  324 mg Oral Daily   . atorvastatin  40 mg Oral Nightly   . chlordiazePOXIDE  25 mg Oral Q4H   . escitalopram oxalate  10 mg Oral Daily   . levETIRAcetam  500 mg Oral TID   . lisinopril  10 mg Oral Daily     Infusions:   . custom IV infusion builder 125 mL/hr at 12/22/13 2306     PRN Medications: acetaminophen, diazepam **OR** diazepam **OR** diazepam **OR** diazepam, haloperidol, hydrALAZINE, magnesium hydroxide, ondansetron, polyethylene glycol, zolpidem    Objective:     Vital Signs:    Vital Sign Ranges for Last 24 Hours:  BP  Min: 158/86  Max: 214/110  Temp  Min: 36.7 ?C (98 ?F)  Max: 37.7 ?C (99.8 ?F)  Pulse  Min: 78  Max: 117  Resp  Min: 16  Max: 20  SpO2  Min: 88 %  Max: 99 %    Most Recent Vitals  BP: 162/87 mmHg  Heart Rate: 79  Resp: 18  Temp: 36.7 ?C (98 ?F)  SpO2: 98 % (12/19 0850)    Intake/Ouput:    Intake/Output  Summary (Last 24 hours) at 12/23/13 1106  Last data filed at 12/23/13 0315   Gross per 24 hour   Intake      0 ml   Output    600 ml   Net   -600 ml        Exam:  General: alert, tremulous, restless, confused  Head: Normocephalic, atraumatic  Chest: CTA bilaterally  Cardiac: RRR, no murmur  Abdomen: Soft, NT, ND, no HSM, no masses  Extremities: Pink, perfused with no edema  Neurologic: Lue and lle weakness and incoordination.  No nystagmus that wife reports on history    Recent Labs:  Results for orders placed or performed during the hospital encounter of 12/22/13 (from the past 24 hour(s))   Comprehensive Metabolic Panel -STAT    Collection Time: 12/22/13  5:16 PM   Result Value Ref Range    Sodium 126 (L) 135 - 143 mmol/L    Potassium 4.1 3.5 - 5.1 mmol/L    Chloride 92 (L) 98 - 111 mmol/L    CO2 - Carbon Dioxide 22.0 21.0 - 31.0 mmol/L  Glucose 82 80 - 99 mg/dL    BUN 8 6.0 - 40.9 mg/dL    Creatinine, Serum 8.11 (L) 0.64 - 1.27 mg/dL    Calcium 9.3 8.6 - 91.4 mg/dL    AST - Aspartate Aminotransferase 88 (H) 10 - 50 IU/L    ALT - Alanine Amino transferase 97 (H) 7 - 52 IU/L    Alkaline Phosphatase 98 34 - 104 IU/L    Bilirubin, Total 1.1 0.3 - 1.2 mg/dL    Protein, Total 7.5 6.0 - 8.0 gm/dL    Albumin 4.5 3.5 - 5.0 gm/dL    Globulin 3.0 2.2 - 3.7 gm/dL    Albumin/Globulin Ratio 1.5 >0.9    Anion Gap 12.0 (H) 3 - 11 mmol/L    GFR Estimate >60 >60 mL/min/1.73sq.m    GFR Additional Info                                                    CBC with Auto Differential -STAT    Collection Time: 12/22/13  5:16 PM   Result Value Ref Range    WBC 7.1 4.8 - 10.8 K/microL    RBC 4.67 (L) 4.70 - 6.10 M/microL    Hgb 16.3 14.0 - 18.0 gm/dL    HCT 78.2 95.6 - 21.3 %    MCV 100.9 (H) 81.0 - 99.0 fL    MCH 34.8 (H) 27.0 - 34.0 pg    MCHC 34.5 32.0 - 36.0 gm/dL    RDW 08.6 57.8 - 46.9 %    Platelet Count 153 150 - 400 K/microL    MPV 8.7 7.4 - 10.4 fL    Differential Type AUTO     Segs (Neutrophils)% 72 (H) 35 - 70 %    Lymphocytes  % 17 (L) 25 - 45 %    Monocytes % 11 0 - 12 %    Eosinophils % 0 0 - 7 %    Basophils % 0 0 - 2 %    Segs (Neutrophils), Absolute 5.1 1.6 - 7.3 K/microL    Lymphocytes, Absolute 1.2 1.1 - 4.3 K/microL    Monocytes, Absolute 0.8 0 - 1.2 K/microL    Eosinophils, Absolute 0.0 0 - 0.7 K/microL    Basophils, Absolute 0.0 0 - 0.2 K/microL   PT & PTT -STAT    Collection Time: 12/22/13  5:16 PM   Result Value Ref Range    Protime 10.6 9.9 - 13.4 sec    INR 0.9 0.9 - 1.1    APTT 34 23 - 36 sec   Troponin-I -STAT    Collection Time: 12/22/13  5:16 PM   Result Value Ref Range    Troponin I <0.03 0.00 - 0.04 ng/mL   Alcohol Medical/Serum -STAT    Collection Time: 12/22/13  5:16 PM   Result Value Ref Range    Alcohol Scrn 111 (H) <10 mg/dL   Magnesium -Next Routine    Collection Time: 12/23/13  4:23 AM   Result Value Ref Range    Magnesium 1.9 1.6 - 2.4 mg/dL   Comprehensive Metabolic Panel -AM Draw    Collection Time: 12/23/13  4:23 AM   Result Value Ref Range    Sodium 127 (L) 135 - 143 mmol/L    Potassium 4.3 3.5 - 5.1 mmol/L    Chloride 93 (L) 98 -  111 mmol/L    CO2 - Carbon Dioxide 26.0 21.0 - 31.0 mmol/L    Glucose 97 80 - 99 mg/dL    BUN 8 6.0 - 16.1 mg/dL    Creatinine, Serum 0.96 0.64 - 1.27 mg/dL    Calcium 9.0 8.6 - 04.5 mg/dL    AST - Aspartate Aminotransferase 74 (H) 10 - 50 IU/L    ALT - Alanine Amino transferase 86 (H) 7 - 52 IU/L    Alkaline Phosphatase 85 34 - 104 IU/L    Bilirubin, Total 1.5 (H) 0.3 - 1.2 mg/dL    Protein, Total 7.2 6.0 - 8.0 gm/dL    Albumin 4.3 3.5 - 5.0 gm/dL    Globulin 2.9 2.2 - 3.7 gm/dL    Albumin/Globulin Ratio 1.5 >0.9    Anion Gap 8.0 3 - 11 mmol/L    GFR Estimate >60 >60 mL/min/1.73sq.m    GFR Additional Info                                                    CBC with Auto Differential -AM Draw    Collection Time: 12/23/13  4:23 AM   Result Value Ref Range    WBC 7.5 4.8 - 10.8 K/microL    RBC 4.54 (L) 4.70 - 6.10 M/microL    Hgb 15.9 14.0 - 18.0 gm/dL    HCT 40.9 81.1 - 91.4 %     MCV 101.1 (H) 81.0 - 99.0 fL    MCH 35.0 (H) 27.0 - 34.0 pg    MCHC 34.7 32.0 - 36.0 gm/dL    RDW 78.2 95.6 - 21.3 %    Platelet Count 140 (L) 150 - 400 K/microL    MPV 9.1 7.4 - 10.4 fL    Segs % 61 35 - 70 %    Lymphocytes % 27 25 - 45 %    Monocytes % 12 0 - 12 %    Segs, Absolute 4.57 1.60 - 7.30 K/microL    Lymphocytes, Absolute 2.03 1.10 - 4.30 K/microL    Monocytes, Absolute 0.90 0 - 1.20 K/microL    Differential Type MICRO     Platelet Estimate DEC (A) NORMAL    RBC Morphology 2+ MACRO (A) NORMAL     Pending Labs     Order Current Status    Keppra (Levetiracetam) In process    Magnesium -Next Routine In process        Recent Imaging:  X-ray Chest Pa Or Ap    12/22/2013   IMPRESSION: Normal portable chest examination.     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  No acute mass effect or hemorrhage noted in the brain.  Acute ischemia not seen    Ultrasound Carotid Bilateral Duplex    12/23/2013   IMPRESSION: Findings compatible with less than 50% stenosis of each internal carotid artery.  Forward flowing vertebral arteries bilaterally.       LOS: 1 day     Franchot Gallo  12/23/2013  11:06 AM

## 2013-12-23 NOTE — Progress Notes (Addendum)
Called to room, patient very agitated, yelling that he needs to undo myself  Attempts made to re-orient, patient states he knows he is in the hospital, staring at the ceiling, stating people can fit between the tiles,  Throwing his legs and arms out of the bed, kneeling and twisting around in the bed.  Responds to name but unable to re-orient to environment and care plan.  Cannot keep telemetry on the patient, pulling at IV lines.  1338- Valium 5 mg IV given and Dr. Carrolyn Meiers paged.

## 2013-12-23 NOTE — ED Provider Notes (Signed)
[facility] EMERGENCY DEPARTMENT ENCOUNTER    History and Physical     Name: Scott Dudley  DOB: 05/26/1960 53 y.o.  MRN: [id number]  CSN: [id number]    HISTORY:     CHIEF COMPLAINT    Chief Complaint   Patient presents with   . Seizures       HPI    Scott Dudley is a 53 y.o. male who present  (hemiparesis apparently was witnessed having a seizure in the emergency room so we don't know what his neurologic status was prior to the seizure.  On my quick assessment he had left-sided neglect and left partial paralysis.  He does has a history of seizure he also has a history of daily drinking alcohol.     PAST MEDICAL HISTORY    Past Medical History   Diagnosis Date   . Seizures    . Hypertension    . Acute left hemiparesis 12/22/2013       SURGICAL HISTORY    Past Surgical History   Procedure Laterality Date   . Knee surgery         CURRENT MEDICATIONS    Prior to Admission medications    Medication Sig Start Date End Date Taking? Authorizing Provider   escitalopram oxalate (LEXAPRO) 10 MG tablet Take 10 mg by mouth daily.   Yes Historical Provider, MD   levETIRAcetam (KEPPRA) 500 MG tablet Take 500 mg by mouth 3 (three) times daily.    Yes Historical Provider, MD   lisinopril (PRINIVIL,ZESTRIL) 10 MG tablet Take 10 mg by mouth daily.   Yes Historical Provider, MD       ALLERGIES    Review of patient's allergies indicates no known allergies.    FAMILY HISTORY    History reviewed. No pertinent family history.    SOCIAL HISTORY    History   Substance Use Topics   . Smoking status: Current Every Day Smoker -- 0.50 packs/day     Types: Cigarettes   . Smokeless tobacco: Never Used   . Alcohol Use: 16.8 oz/week     28 Shots of liquor per week       REVIEW OF SYSTEMS:   Constitutional:  Denies fever, chills, weight loss or weakness   Eyes:  Denies photophobia or discharge   HENT:  Denies sore throat or ear pain   Respiratory:  Denies cough or shortness of breath   Cardiovascular:  Denies chest pain, palpitations or swelling   GI:   Denies abdominal pain, nausea, vomiting, or diarrhea   Musculoskeletal:  Denies back pain   Skin:  Denies rash   Neurologic:  Denies headache, focal weakness or sensory changes   Endocrine:  Denies polyuria or polydypsia   Lymphatic:  Denies swollen glands   Psychiatric:  Denies depression,anxiety.  All systems negative except as marked.     PHYSICAL EXAM:   VITAL SIGNS:    Initial Vitals   BP 12/22/13 1727 182/105 mmHg   Heart Rate 12/22/13 1727 86   Resp 12/22/13 1727 20   Temp 12/22/13 1727 37 ?C (98.6 ?F)   SpO2 12/22/13 1727 96 %     Constitutional:  Well developed, Well nourished, No acute distress, Non-toxic appearance.   HENT:  Right-sided facial's paralysis and right-sided facial neglect   Eyes:  PERRL, EOMI, Conjunctiva normal, No discharge.   Respiratory:  Normal breath sounds, No respiratory distresCardiovascular:  Normal heart rate, Normal rhythm, No murmurs, No rubs, No gallops.   GI:  Bowel sounds normal, Soft,   Musculoskeletal:  No mobility of his right side   Integument:  Warm, Dry, No erythema, No rash.      Neurologic:  Alert & oriented x 3,No focal deficits noted.   Psychiatric:  Affect normal, Judgment normal, Mood normal.     DATA:     EKG      LABS    Results for orders placed or performed during the hospital encounter of 12/22/13 (from the past 24 hour(s))   Comprehensive Metabolic Panel -STAT    Collection Time: 12/22/13  5:16 PM   Result Value Ref Range    Sodium 126 (L) 135 - 143 mmol/L    Potassium 4.1 3.5 - 5.1 mmol/L    Chloride 92 (L) 98 - 111 mmol/L    CO2 - Carbon Dioxide 22.0 21.0 - 31.0 mmol/L    Glucose 82 80 - 99 mg/dL    BUN 8 6.0 - 47.8 mg/dL    Creatinine, Serum 2.95 (L) 0.64 - 1.27 mg/dL    Calcium 9.3 8.6 - 62.1 mg/dL    AST - Aspartate Aminotransferase 88 (H) 10 - 50 IU/L    ALT - Alanine Amino transferase 97 (H) 7 - 52 IU/L    Alkaline Phosphatase 98 34 - 104 IU/L    Bilirubin, Total 1.1 0.3 - 1.2 mg/dL    Protein, Total 7.5 6.0 - 8.0 gm/dL    Albumin 4.5 3.5 - 5.0 gm/dL      Globulin 3.0 2.2 - 3.7 gm/dL    Albumin/Globulin Ratio 1.5 >0.9    Anion Gap 12.0 (H) 3 - 11 mmol/L    GFR Estimate >60 >60 mL/min/1.73sq.m    GFR Additional Info                                                    CBC with Auto Differential -STAT    Collection Time: 12/22/13  5:16 PM   Result Value Ref Range    WBC 7.1 4.8 - 10.8 K/microL    RBC 4.67 (L) 4.70 - 6.10 M/microL    Hgb 16.3 14.0 - 18.0 gm/dL    HCT 30.8 65.7 - 84.6 %    MCV 100.9 (H) 81.0 - 99.0 fL    MCH 34.8 (H) 27.0 - 34.0 pg    MCHC 34.5 32.0 - 36.0 gm/dL    RDW 96.2 95.2 - 84.1 %    Platelet Count 153 150 - 400 K/microL    MPV 8.7 7.4 - 10.4 fL    Differential Type AUTO     Segs (Neutrophils)% 72 (H) 35 - 70 %    Lymphocytes % 17 (L) 25 - 45 %    Monocytes % 11 0 - 12 %    Eosinophils % 0 0 - 7 %    Basophils % 0 0 - 2 %    Segs (Neutrophils), Absolute 5.1 1.6 - 7.3 K/microL    Lymphocytes, Absolute 1.2 1.1 - 4.3 K/microL    Monocytes, Absolute 0.8 0 - 1.2 K/microL    Eosinophils, Absolute 0.0 0 - 0.7 K/microL    Basophils, Absolute 0.0 0 - 0.2 K/microL   PT & PTT -STAT    Collection Time: 12/22/13  5:16 PM   Result Value Ref Range    Protime 10.6 9.9 - 13.4 sec  INR 0.9 0.9 - 1.1    APTT 34 23 - 36 sec   Troponin-I -STAT    Collection Time: 12/22/13  5:16 PM   Result Value Ref Range    Troponin I <0.03 0.00 - 0.04 ng/mL   Alcohol Medical/Serum -STAT    Collection Time: 12/22/13  5:16 PM   Result Value Ref Range    Alcohol Scrn 111 (H) <10 mg/dL       RADIOLOGY/PROCEDURES    X-ray Chest Pa Or Ap    12/22/2013     XR CHEST PA OR AP   12/22/2013 17:51:12  PROVIDED HISTORY: Weakness   COMPARISON: None.  ADDITIONAL HISTORY: None.   FINDINGS: No active disease of the lung or pleura. The cardiomediastinal silhouette and bones are normal.      12/22/2013   IMPRESSION: Normal portable chest examination.     Ct Head Without Contrast    12/22/2013     CT HEAD WO CONTRAST   12/22/2013 17:31:19  PROVIDED HISTORY: Seizure, Rule out stroke, L arm numbness     COMPARISONS: 09/04/2007  ADDITIONAL HISTORY: None.   TECHNIQUE: Axial CT images of the head are acquired from the foramen magnum to vertex.  Axial images are reconstructed with both standard and bone algorithms.  No contrast is administered.  FINDINGS: There is moderate prominence of the CSF containing spaces consistent with atrophy.  Low-density encephalomalacia is noted in the right frontal lobe within the anterior cranial fossa.  No acute hemorrhage, mass lesion or mass effect is present.   There is no shift of midline structures.  The gray-white matter differentiation of the remainder of the cerebral hemispheres are normal.  There is no linear or depressed skull fracture.  The paranasal sinuses and mastoid air cells are clear.     12/22/2013   IMPRESSION: Extensive, old infarction of the right frontal lobe, also present on the prior CT scan.  No acute mass effect or hemorrhage noted in the brain.  Acute ischemia not seen      PLAN:     ED COURSE & MEDICAL DECISION MAKING    Pertinent Labs & Imaging studies reviewed. (See chart for details)   Patient with old infarction of the right frontal lobe of course we knew he had a large brain infarct in the past no signs of acute ischemia at this point.She has having the exact hemiparesthesias that he had with his left stroke which was documented on the head CT so this could be a Todd's paralysis left over from his seizure and he couldn't respond quickly and nicely back to his baseline   or it seems that this could be another bad stroke  really watch carefully for signs of alcohol withdrawal I signed patient out to the hospitalist .. .    FINAL IMPRESSION    1. Right-sided CVA          Kandice Robinsons, MD  01/06/14 2054

## 2013-12-23 NOTE — Plan of Care (Signed)
Problem: Knowledge Deficit  Goal: Patient/family/caregiver demonstrates understanding of disease process, treatment plan, medications, and discharge instructions  Complete learning assessment and assess knowledge base.  Outcome: Not Progressing  Pt becoming increasingly confused throughout the night.

## 2013-12-23 NOTE — Progress Notes (Signed)
Pt pulling out of 4 point soft restraints, restless, and agitated despite PRN meds administered to help calm him down. He follows demands if said in a stern/direct way. Got orders for sitter at bedside so that we could have someone there to give commands as needed in attempt to help keep pt in restraints and unharmed.

## 2013-12-23 NOTE — Progress Notes (Signed)
Was able to get tele unit back on patient but only once asleep. Pt keeps saying he needs to get this thing holding him off

## 2013-12-23 NOTE — Other (Signed)
Physical Therapy Evaluation    Patient name:  Scott Dudley  Date:  12/23/2013     PATIENT INFORMATION    Primary Diagnosis:    Patient Active Problem List   Diagnosis SNOMED CT(R)   . Simple febrile convulsions FEBRILE CONVULSION   . Seizure disorder SEIZURE DISORDER   . Hypertension, benign BENIGN HYPERTENSION   . Acute left hemiparesis LEFT HEMIPARESIS   . Acute alcohol intoxication ALCOHOL INTOXICATION   . CVA (cerebral infarction) CEREBRAL INFARCTION   . Left hemiparesis LEFT HEMIPARESIS       Surgery:       Days Post surgery:       Pertinent Medical/Surgical Hx:    Past Medical History   Diagnosis Date   . Seizures    . Hypertension    . Acute left hemiparesis 12/22/2013     Past Surgical History   Procedure Laterality Date   . Knee surgery           Precautions:  Fall risk, seizure precautions, confused and agitated, ETOH     Hx of Onset:  Pt admitted to hospital with acute L hemiparesis, L neglect and L facial paralysis, hx of old infarct on MRI    PLOF:  Pt unable to answer questions when asked about PLOF, appears confused    Social Situation:  Unknown, wife seen to be present in room speaking to MD, but left when PT entered to assist pt to bedside commode    Home Accessibility/ Stairs:   unknown     Equipment:  unknown      SUBJECTIVE    Pt noted to have L side neglect, some nystagmus, increased tone L side esp L UE    Pain: Pain does not appear limit patient's functional ability at present.         OBJECTIVE    Appliances:  none    Observation:  Pt stating he needs to have a bowel movement and agreeable to assist from PT to [facility]    Mental Status/LOC/Orientation:  AxOx0    Sensation:  R side appears functional    Strength:  Functional R side    ROM:  Functional R side    Balance:  Sit Static: poor  Sit Dynamic: poor  Stand Static: poor  Stand Dynamic: poor    Bed Mobility: min A supine<> sit      Transfers:  Min A of two people bed to bedside commode    Gait:   [name] walked 50 feet with handheld  assist of two people and min A, attempted to use 2ww but pt had poor control of LUE and L neglect          Vitals: No significant changes in vital signs noted.     Today's Treatment:    Evaluation Completed and Goals addressed.  Patient left in room in bed with call light.  Phone left within reach.  Bed Alarm on.    ASSESSMENT        Rehab Potential:  fair    Discharge Recommendations: at current level of functional ability pt may require SNF to improve independence in mobility, strength, and safety      PLAN    Interventions:    Therapeutic Exercise       Bed Mobility Training  Transfer Training  Garment/textile technologist  DME/Equipment Assessment     Frequency:   1 X/day (M-F)  1 X/day (Saturday or Sunday)    Duration:  LOS, or as indicated      Therapist Goals (by discharge):    Patient/Caregiver GOALS reviewed and integrated with rehab treatment plan:  Multidisciplinary Problems (Active)        Problem: IP General Patient Problem List    Goal Priority Disciplines Outcome   Bed Mobility     PT    Description:  Patient to be independent with bed mobility, following any pertinent precautions.    Transfers     PT    Description:  Patient to perform all functional transfers independently.    Ambulation - Levels     PT    Description:  Patient to ambulate independently x 200 feet with appropriate assistive device.                   Patient/Family Goal:  Pt unable to state                Therapist/License #: Milagros Loll, [id number]    Start Time: 1030 /Stop Time: 1130

## 2013-12-24 MED ORDER — nystatin (MYCOSTATIN) powder
100000 | Freq: Two times a day (BID) | TOPICAL | Status: DC
Start: 2013-12-24 — End: 2013-12-28
  Administered 2013-12-25 – 2013-12-28 (×8): via TOPICAL

## 2013-12-24 MED FILL — HYDRALAZINE 20 MG/ML INJECTION SOLUTION: 20 mg/mL | INTRAMUSCULAR | Qty: 1

## 2013-12-24 MED FILL — LORAZEPAM 2 MG/ML INJECTION SOLUTION: 2 mg/mL | INTRAMUSCULAR | Qty: 1

## 2013-12-24 MED FILL — LEVETIRACETAM 500 MG TABLET: 500 mg | ORAL | Qty: 1

## 2013-12-24 MED FILL — CHLORDIAZEPOXIDE 25 MG CAPSULE: 25 mg | ORAL | Qty: 1

## 2013-12-24 MED FILL — LORAZEPAM 1 MG TABLET: 1 mg | ORAL | Qty: 2

## 2013-12-24 MED FILL — NYSTATIN 100,000 UNIT/GRAM TOPICAL POWDER: 100000 [IU]/g | TOPICAL | Qty: 15

## 2013-12-24 MED FILL — LORAZEPAM 0.5 MG TABLET: 0.5 mg | ORAL | Qty: 1

## 2013-12-24 MED FILL — LORAZEPAM 0.5 MG TABLET: 0.5 mg | ORAL | Qty: 2

## 2013-12-24 MED FILL — ATORVASTATIN 40 MG TABLET: 40 mg | ORAL | Qty: 1

## 2013-12-24 MED FILL — DEXTROSE 5 % AND 0.9 % SODIUM CHLORIDE INTRAVENOUS SOLUTION: INTRAVENOUS | Qty: 1000

## 2013-12-24 MED FILL — LISINOPRIL 10 MG TABLET: 10 mg | ORAL | Qty: 1

## 2013-12-24 MED FILL — ASPIRIN 81 MG CHEWABLE TABLET: 81 mg | ORAL | Qty: 4

## 2013-12-24 MED FILL — HALOPERIDOL 5 MG TABLET: 5 mg | ORAL | Qty: 1

## 2013-12-24 MED FILL — LORAZEPAM 1 MG TABLET: 1 mg | ORAL | Qty: 1

## 2013-12-24 MED FILL — ESCITALOPRAM 10 MG TABLET: 10 mg | ORAL | Qty: 1

## 2013-12-24 NOTE — Progress Notes (Signed)
Daily Progress Note     Name: Scott Dudley  DOB: 1960-01-10 53 y.o.  MRN: [id number]  CSN: [id number]    Assessment & Plan:     Active Hospital Problems    Diagnosis SNOMED CT(R) Date Noted   . Simple febrile convulsions FEBRILE CONVULSION 12/22/2013   . Seizure disorder SEIZURE DISORDER 12/22/2013   . Hypertension, benign BENIGN HYPERTENSION 12/22/2013   . Acute left hemiparesis LEFT HEMIPARESIS 12/22/2013   . Acute alcohol intoxication ALCOHOL INTOXICATION 12/22/2013   . CVA (cerebral infarction) CEREBRAL INFARCTION 12/22/2013   . Left hemiparesis LEFT HEMIPARESIS 12/22/2013     He continues to have left hemiparesis. continue asa, statin,  Unable to get nmri due to severe agitation  Carotid u/s negative for blockages.    He is confused and tremulous and agitated, continue bzd prn on ciwa protocol and haldol prn    Subjective:     Scott Dudley is a 53 y.o. male patient. Patient was admitted with Acute left hemiparesis  and has had a stable course since that time.     Scheduled Medications:   . aspirin  324 mg Oral Daily   . atorvastatin  40 mg Oral Nightly   . chlordiazePOXIDE  25 mg Oral Q4H   . escitalopram oxalate  10 mg Oral Daily   . haloperidol lactate  2 mg Intravenous Once   . levETIRAcetam  500 mg Oral TID   . lisinopril  10 mg Oral Daily   . nystatin   Topical 2 times per day     Infusions:   . custom IV infusion builder 125 mL/hr at 12/24/13 0604     PRN Medications: acetaminophen, haloperidol, hydrALAZINE, LORazepam **OR** LORazepam **OR** LORazepam **OR** LORazepam, magnesium hydroxide, ondansetron, polyethylene glycol, zolpidem    Objective:     Vital Signs:    Vital Sign Ranges for Last 24 Hours:  BP  Min: 158/108  Max: 204/113  Temp  Min: 36.6 ?C (97.8 ?F)  Max: 37.7 ?C (99.9 ?F)  Pulse  Min: 61  Max: 85  Resp  Min: 16  Max: 18  SpO2  Min: 94 %  Max: 100 %    Most Recent Vitals  BP: 204/113 mmHg  Heart Rate: 72  Resp: 18  Temp: 36.6 ?C (97.8 ?F)  SpO2: 99 % (12/20  0800)    Intake/Ouput:    Intake/Output Summary (Last 24 hours) at 12/24/13 1038  Last data filed at 12/24/13 0535   Gross per 24 hour   Intake   3186 ml   Output    625 ml   Net   2561 ml        Exam:  General: alert, tremulous, restless, confused  Head: Normocephalic, atraumatic  Chest: CTA bilaterally  Cardiac: RRR, no murmur  Abdomen: Soft, NT, ND, no HSM, no masses  Extremities: Pink, perfused with no edema  Neurologic: Lue and lle weakness and incoordination.  No nystagmus that wife reports on history    Recent Labs:  No results found for this visit on 12/22/13 (from the past 24 hour(s)).  Pending Labs     Order Current Status    Keppra (Levetiracetam) In process    Magnesium -Next Routine In process        Recent Imaging:  X-ray Chest Pa Or Ap    12/22/2013   IMPRESSION: Normal portable chest examination.     Ct Head Without Contrast    12/22/2013   IMPRESSION: Extensive, old infarction  of the right frontal lobe, also present on the prior CT scan.  No acute mass effect or hemorrhage noted in the brain.  Acute ischemia not seen    Ultrasound Carotid Bilateral Duplex    12/23/2013   IMPRESSION: Findings compatible with less than 50% stenosis of each internal carotid artery.  Forward flowing vertebral arteries bilaterally.       LOS: 2 days     Franchot Gallo  12/24/2013  10:38 AM

## 2013-12-25 ENCOUNTER — Inpatient Hospital Stay: Payer: MEDICARE

## 2013-12-25 LAB — CBC WITH AUTO DIFFERENTIAL
Eosinophils %: 2 % (ref 0–7)
Eosinophils, Absolute: 0.13 10*3/uL (ref 0–0.70)
HCT: 45.5 % (ref 42.0–54.0)
Hgb: 15.9 gm/dL (ref 14.0–18.0)
Lymphocytes %: 7 % — ABNORMAL LOW (ref 25–45)
Lymphocytes, Absolute: 0.47 10*3/uL — ABNORMAL LOW (ref 1.10–4.30)
MCH: 35.6 pg — ABNORMAL HIGH (ref 27.0–34.0)
MCHC: 35 gm/dL (ref 32.0–36.0)
MCV: 101.7 fL — ABNORMAL HIGH (ref 81.0–99.0)
MPV: 9.9 fL (ref 7.4–10.4)
Monocytes %: 16 % — ABNORMAL HIGH (ref 0–12)
Monocytes, Absolute: 1.07 10*3/uL (ref 0–1.20)
Platelet Count: 124 10*3/uL — ABNORMAL LOW (ref 150–400)
RBC: 4.47 10*6/uL — ABNORMAL LOW (ref 4.70–6.10)
RDW: 14.4 % (ref 11.5–14.5)
Segs %: 75 % — ABNORMAL HIGH (ref 35–70)
Segs, Absolute: 5.03 10*3/uL (ref 1.60–7.30)
WBC: 6.7 10*3/uL (ref 4.8–10.8)

## 2013-12-25 LAB — URINALYSIS, ROUTINE
Bilirubin, urine: NEGATIVE
Blood, urine: NEGATIVE
Glucose, UA: NEGATIVE mg/dl
Leukocyte Esterase, urine: NEGATIVE
Nitrite, urine: NEGATIVE
Protein, urine: NEGATIVE
Specific Gravity, urine: 1.012 (ref 1.003–1.030)
Urobilinogen, urine: 2 EU/dL — AB
pH, UA: 6.5 (ref 5.0–8.5)

## 2013-12-25 LAB — MAGNESIUM: Magnesium: 2.2 mg/dL (ref 1.6–2.4)

## 2013-12-25 LAB — BASIC METABOLIC PANEL
Anion Gap: 7 mmol/L (ref 3–11)
BUN: 3 mg/dL — ABNORMAL LOW (ref 6.0–23.0)
CO2 - Carbon Dioxide: 21 mmol/L (ref 21.0–31.0)
Calcium: 8.8 mg/dL (ref 8.6–10.3)
Chloride: 102 mmol/L (ref 98–111)
Creatinine, Serum: 0.56 mg/dL — ABNORMAL LOW (ref 0.64–1.27)
GFR Estimate: >60 mL/min/{1.73_m2} (ref >60)
Glucose: 107 mg/dL — ABNORMAL HIGH (ref 80–99)
Potassium: 4.2 mmol/L (ref 3.5–5.1)
Sodium: 130 mmol/L — ABNORMAL LOW (ref 135–143)

## 2013-12-25 LAB — PHOSPHORUS: Phosphorus: 4.3 mg/dL (ref 2.4–4.5)

## 2013-12-25 MED FILL — ASPIRIN 81 MG CHEWABLE TABLET: 81 mg | ORAL | Qty: 4

## 2013-12-25 MED FILL — LISINOPRIL 10 MG TABLET: 10 mg | ORAL | Qty: 1

## 2013-12-25 MED FILL — CHLORDIAZEPOXIDE 25 MG CAPSULE: 25 mg | ORAL | Qty: 1

## 2013-12-25 MED FILL — LORAZEPAM 2 MG/ML INJECTION SOLUTION: 2 mg/mL | INTRAMUSCULAR | Qty: 1

## 2013-12-25 MED FILL — LEVETIRACETAM 500 MG TABLET: 500 mg | ORAL | Qty: 1

## 2013-12-25 MED FILL — HALOPERIDOL 5 MG TABLET: 5 mg | ORAL | Qty: 1

## 2013-12-25 MED FILL — ESCITALOPRAM 10 MG TABLET: 10 mg | ORAL | Qty: 1

## 2013-12-25 MED FILL — DEXTROSE 5 % AND 0.9 % SODIUM CHLORIDE INTRAVENOUS SOLUTION: INTRAVENOUS | Qty: 1000

## 2013-12-25 MED FILL — ATORVASTATIN 40 MG TABLET: 40 mg | ORAL | Qty: 1

## 2013-12-25 MED FILL — HYDRALAZINE 20 MG/ML INJECTION SOLUTION: 20 mg/mL | INTRAMUSCULAR | Qty: 1

## 2013-12-25 NOTE — Progress Notes (Signed)
Restless when awake with attempts to get OOB.  Is able to follow some instructions-such as checking grips.  Then self talks and states I'm getting a sponge bath. Is able to answer some questions appropriately.  Sleeping at present.  Misty Stanley, wife called, requests restraints be stopped.. D/C and will monitor. PSA at bedside.

## 2013-12-25 NOTE — Treatment Summary (Signed)
Occupational Therapy  Pt. Evaluation not completed.  He is disoriented, but can follow brief commands. Sat up on EOB and then wanted to stand and didn't know where he was.  Had pt lie down and set up his lunch tray and he ate some noodle salad and has poor coordination with utensil.  He will not be able to be physically stopped by one person if he has it in his mind he is getting up.  He is very strong in all 4 extremities

## 2013-12-25 NOTE — Progress Notes (Signed)
Pt sent to MRI but unable to tolerate procedure.  MRI not completed.  Pt sent back to the floor. Sharah Finnell L. Giovanna Kemmerer

## 2013-12-25 NOTE — Treatment Summary (Signed)
IP PT DAILY NOTE  (NOT SEEN)  Patient name: Scott Dudley    Date of Birth: February 01, 1960 Date: 12/25/2013   Gender: male  MRN: [id number]     Diagnosis:    Patient Active Problem List   Diagnosis SNOMED CT(R)   . Simple febrile convulsions FEBRILE CONVULSION   . Seizure disorder SEIZURE DISORDER   . Hypertension, benign BENIGN HYPERTENSION   . Acute left hemiparesis LEFT HEMIPARESIS   . Acute alcohol intoxication ALCOHOL INTOXICATION   . CVA (cerebral infarction) CEREBRAL INFARCTION   . Left hemiparesis LEFT HEMIPARESIS        PRECAUTIONS: Fall risk, seizure precautions, confused and agitated, ETOH     SUBJECTIVE  NA      OBJECTIVE  Scott Dudley was not seen for the following reason:    Procedure/unavailable: Pt was being loaded up for MRI.        ASSESSMENT   No Treatment provided.      PLAN   See pt tomorrow.    Therapist/License #: Felesha Moncrieffe W. Carmelo Reidel, PT

## 2013-12-25 NOTE — Progress Notes (Signed)
Daily Progress Note     Name: Scott Dudley  DOB: [birth year] 53 y.o.  MRN: [id number]  CSN: [id number]    Assessment & Plan:     Active Hospital Problems    Diagnosis SNOMED CT(R) Date Noted   . Simple febrile convulsions FEBRILE CONVULSION 12/22/2013   . Seizure disorder SEIZURE DISORDER 12/22/2013   . Hypertension, benign BENIGN HYPERTENSION 12/22/2013   . Acute left hemiparesis LEFT HEMIPARESIS 12/22/2013   . Acute alcohol intoxication ALCOHOL INTOXICATION 12/22/2013   . CVA (cerebral infarction) CEREBRAL INFARCTION 12/22/2013   . Left hemiparesis LEFT HEMIPARESIS 12/22/2013     Scott Dudley is a 53 y.o. male who presents for evaluation of left hemiparesis, which occurred after a seizure and in the setting of acute alcohol intoxication.     # Acute left hemiparesis  Concern was for new acute CVA vs Todd's paralysis after seizure. CT demonstrated extensive old infarct but nothing new. Unable to get MRI due to agitation despite medications. May need to undergo withdrawal completely prior to obtaining this test. His exam seems improved today and I am not noting any obvious strength deficits at this time.  His neglect is a little more difficult to definitively assess in the setting of his deliriums secondary to withdrawal. Carotid ultrasound normal.   - MRI when able  - Continue ASA and atorvastatin  - PT/OT    # Seizure disorder  No additional events since admission. Continue Keppra TID.    # Delirium  # Alcohol withdrawal  Still very agitated and tremulous today.  - Continue librium, will start scheduled taper tomorrow  - Continue CIWA    Diet: regular  DVT: IPC  Dispo: TBD  Code: Full code    Subjective:   Feels ok today. He remains quite confused. States that it is 2088.    Scheduled Medications: . aspirin  324 mg Oral Daily   . atorvastatin  40 mg Oral Nightly   . chlordiazePOXIDE  25 mg Oral Q4H   . escitalopram oxalate  10 mg Oral Daily   . haloperidol lactate  2 mg Intravenous Once   . levETIRAcetam  500  mg Oral TID   . lisinopril  10 mg Oral Daily   . nystatin   Topical 2 times per day     Infusions:    PRN Medications: acetaminophen, haloperidol, hydrALAZINE, LORazepam **OR** LORazepam **OR** LORazepam **OR** LORazepam, magnesium hydroxide, ondansetron, polyethylene glycol, zolpidem    Objective:     Vital Signs:  Vital Sign Ranges for Last 24 Hours:  BP  Min: 149/92  Max: 185/100  Temp  Min: 36.7 ?C (98.1 ?F)  Max: 37.1 ?C (98.8 ?F)  Pulse  Min: 61  Max: 96  Resp  Min: 20  Max: 20  SpO2  Min: 96 %  Max: 98 %    Most Recent Vitals  BP: 150/94 mmHg  Heart Rate: 96  Resp: 20  Temp: 37.1 ?C (98.8 ?F)  SpO2: 97 % (12/21 1505)    Intake/Ouput:    Intake/Output Summary (Last 24 hours) at 12/25/13 1759  Last data filed at 12/25/13 1639   Gross per 24 hour   Intake   5090 ml   Output    980 ml   Net   4110 ml      Exam:  Physical Exam  General: Alert, tremulous, restless  Head: Normocephalic, atraumatic  Chest: CTA bilaterally  Cardiac: RRR, no murmur  Abdomen: Soft, NT, ND, no HSM,  no masses  Extremities: Pink, perfused with no edema  Neurologic: Strength intact and symmetric in all extremities, very delirious, oriented to place, not time or situation    Recent Labs:  Results for orders placed or performed during the hospital encounter of 12/22/13 (from the past 24 hour(s))   Urinalysis, Routine -STAT    Collection Time: 12/25/13 10:07 AM   Result Value Ref Range    Urine Color YELLOW     Urine Character HAZY     Glucose, urine NEGATIVE NEGATIVE mg/dl    Bilirubin, urine NEGATIVE NEGATIVE    Ketones, urine 1+ (A) NEGATIVE    Specific Gravity, urine 1.012 1.003 - 1.030    Blood, urine NEGATIVE NEGATIVE    pH, UA 6.5 5.0 - 8.5    Protein, urine NEGATIVE NEGATIVE    Urobilinogen, urine 2 (A) NEGATIVE EU/dL    Nitrite, urine NEGATIVE NEGATIVE    Leukocyte Esterase, urine NEGATIVE NEGATIVE   CBC with Auto Differential -Now and AM    Collection Time: 12/25/13 11:29 AM   Result Value Ref Range    WBC 6.7 4.8 - 10.8 K/microL     RBC 4.47 (L) 4.70 - 6.10 M/microL    Hgb 15.9 14.0 - 18.0 gm/dL    HCT 84.6 96.2 - 95.2 %    MCV 101.7 (H) 81.0 - 99.0 fL    MCH 35.6 (H) 27.0 - 34.0 pg    MCHC 35.0 32.0 - 36.0 gm/dL    RDW 84.1 32.4 - 40.1 %    Platelet Count 124 (L) 150 - 400 K/microL    MPV 9.9 7.4 - 10.4 fL    Segs % 75 (H) 35 - 70 %    Lymphocytes % 7 (L) 25 - 45 %    Monocytes % 16 (H) 0 - 12 %    Eosinophils % 2 0 - 7 %    Segs, Absolute 5.03 1.60 - 7.30 K/microL    Lymphocytes, Absolute 0.47 (L) 1.10 - 4.30 K/microL    Monocytes, Absolute 1.07 0 - 1.20 K/microL    Eosinophils, Absolute 0.13 0 - 0.70 K/microL    Differential Type MICRO     Platelet Estimate DEC (A) NORMAL    RBC Morphology 1+ MACRO (A) NORMAL   Basic Metabolic Panel -Now and AM    Collection Time: 12/25/13 11:29 AM   Result Value Ref Range    Sodium 130 (L) 135 - 143 mmol/L    Potassium 4.2 3.5 - 5.1 mmol/L    Chloride 102 98 - 111 mmol/L    CO2 - Carbon Dioxide 21.0 21.0 - 31.0 mmol/L    Glucose 107 (H) 80 - 99 mg/dL    BUN 3 (L) 6.0 - 02.7 mg/dL    Creatinine, Serum 2.53 (L) 0.64 - 1.27 mg/dL    Calcium 8.8 8.6 - 66.4 mg/dL    Anion Gap 7.0 3 - 11 mmol/L    GFR Estimate >60 >60 mL/min/1.73sq.m    GFR Additional Info                                                      Pending Labs     Order Current Status    Urine Culture Indicated/Ord w/Urinalysis -STAT Collected (12/25/13 1015)    Magnesium In process    Phosphorus In  process        Recent Imaging:  No results found.     LOS: 3 days     Lenor Coffin, MD MPH  12/25/2013 5:59 PM

## 2013-12-26 ENCOUNTER — Inpatient Hospital Stay: Admit: 2013-12-26 | Payer: MEDICARE

## 2013-12-26 LAB — BASIC METABOLIC PANEL
Anion Gap: 7 mmol/L (ref 3–11)
BUN: 4 mg/dL — ABNORMAL LOW (ref 6.0–23.0)
CO2 - Carbon Dioxide: 23 mmol/L (ref 21.0–31.0)
Calcium: 9.1 mg/dL (ref 8.6–10.3)
Chloride: 100 mmol/L (ref 98–111)
Creatinine, Serum: 0.66 mg/dL (ref 0.64–1.27)
GFR Estimate: >60 mL/min/{1.73_m2} (ref >60)
Glucose: 89 mg/dL (ref 80–99)
Potassium: 4.2 mmol/L (ref 3.5–5.1)
Sodium: 130 mmol/L — ABNORMAL LOW (ref 135–143)

## 2013-12-26 LAB — CBC WITH AUTO DIFFERENTIAL
Basophils %: 1 % (ref 0–2)
Basophils, Absolute: 0.1 10*3/uL (ref 0–0.2)
Eosinophils %: 2 % (ref 0–7)
Eosinophils, Absolute: 0.2 10*3/uL (ref 0–0.7)
HCT: 47.3 % (ref 42.0–54.0)
Hgb: 16.3 gm/dL (ref 14.0–18.0)
Lymphocytes %: 20 % — ABNORMAL LOW (ref 25–45)
Lymphocytes, Absolute: 1.5 10*3/uL (ref 1.1–4.3)
MCH: 35 pg — ABNORMAL HIGH (ref 27.0–34.0)
MCHC: 34.5 gm/dL (ref 32.0–36.0)
MCV: 101.4 fL — ABNORMAL HIGH (ref 81.0–99.0)
MPV: 8.8 fL (ref 7.4–10.4)
Monocytes %: 15 % — ABNORMAL HIGH (ref 0–12)
Monocytes, Absolute: 1.1 10*3/uL (ref 0–1.2)
Neutrophils, Absolute: 4.7 10*3/uL (ref 1.6–7.3)
Platelet Count: 140 10*3/uL — ABNORMAL LOW (ref 150–400)
RBC: 4.66 10*6/uL — ABNORMAL LOW (ref 4.70–6.10)
RDW: 14.1 % (ref 11.5–14.5)
Segs (Neutrophils)%: 62 % (ref 35–70)
WBC: 7.4 10*3/uL (ref 4.8–10.8)

## 2013-12-26 MED ORDER — thiamine (Vitamin B-1) tablet 100 mg
100 | Freq: Every day | ORAL | Status: DC
Start: 2013-12-26 — End: 2013-12-28
  Administered 2013-12-27 – 2013-12-28 (×3): 100 mg via ORAL

## 2013-12-26 MED ORDER — folic acid (FOLVITE) tablet 1 mg
1 | Freq: Every day | ORAL | Status: DC
Start: 2013-12-26 — End: 2013-12-28
  Administered 2013-12-27 – 2013-12-28 (×3): 1 mg via ORAL

## 2013-12-26 MED ORDER — gadobenate dimeglumine (MULTIHANCE) injection 18 mL
529 | Freq: Once | INTRAVENOUS | Status: AC
Start: 2013-12-26 — End: 2013-12-26
  Administered 2013-12-26: 22:00:00 529 mL via INTRAVENOUS

## 2013-12-26 MED ORDER — multivitamin w/iron-minerals (THERA-M) tablet 1 tablet
27-0.4 | Freq: Every day | ORAL | Status: DC
Start: 2013-12-26 — End: 2013-12-28
  Administered 2013-12-27 – 2013-12-28 (×3): via ORAL

## 2013-12-26 MED ORDER — chlordiazePOXIDE (LIBRIUM) capsule 25 mg
25 | Freq: Four times a day (QID) | ORAL | Status: DC
Start: 2013-12-26 — End: 2013-12-26

## 2013-12-26 MED ORDER — sodium chloride 0.9 % (NS) syringe 10 mL
INTRAMUSCULAR | Status: DC | PRN
Start: 2013-12-26 — End: 2013-12-28

## 2013-12-26 MED ORDER — sodium chloride 0.9 % (NS) syringe 10 mL
Freq: Three times a day (TID) | INTRAMUSCULAR | Status: DC
Start: 2013-12-26 — End: 2013-12-28
  Administered 2013-12-26 – 2013-12-28 (×6): via INTRAVENOUS

## 2013-12-26 MED FILL — LISINOPRIL 10 MG TABLET: 10 mg | ORAL | Qty: 1

## 2013-12-26 MED FILL — ASPIRIN 81 MG CHEWABLE TABLET: 81 mg | ORAL | Qty: 4

## 2013-12-26 MED FILL — LORAZEPAM 2 MG/ML INJECTION SOLUTION: 2 mg/mL | INTRAMUSCULAR | Qty: 1

## 2013-12-26 MED FILL — LORAZEPAM 1 MG TABLET: 1 mg | ORAL | Qty: 2

## 2013-12-26 MED FILL — ESCITALOPRAM 10 MG TABLET: 10 mg | ORAL | Qty: 1

## 2013-12-26 MED FILL — CHLORDIAZEPOXIDE 25 MG CAPSULE: 25 mg | ORAL | Qty: 1

## 2013-12-26 MED FILL — LEVETIRACETAM 500 MG TABLET: 500 mg | ORAL | Qty: 1

## 2013-12-26 MED FILL — ATORVASTATIN 40 MG TABLET: 40 mg | ORAL | Qty: 1

## 2013-12-26 NOTE — Progress Notes (Signed)
Wife called RN and did not have pt's security code. RN explained what the code is and why we have it. Code was then given to wife.

## 2013-12-26 NOTE — Discharge Planning (AHS/AVS) (Signed)
DISCHARGE PLANNING ASSESSMENT      Plan: Pt lives at home with his wife. She says she can no longer care for him. She is requesting SNF for him. Pt not alert enough to choose SNF. Wife chooses [facility]. Will make referral after he works with therapy.       PATIENT HISTORY     Primary Diagnosis: CVA (cerebral infarction)  Left hemiparesis  Left hemiparesis          Support Systems/Services (Current)  Living Arrangements: Spouse/significant other, Friends  Type of Residence: Private residence  Support Systems: Spouse/significant other     ADL Screening  Patient is independent with ADLs: Yes  Assistive Devices Used at [employer] Used at Home: Eyeglasses/contacts  Resource Management Assessment  Referral Source: Physician, Family    ASSESSMENT       General Observations: Pt sleepy. Unable to carry on cohesive conversation. Wife is requesting that he be placed. She is unable to care for him at home. He is weak and needs rehab. Have also requested they fill out Medicaid application. Wife is in agreement and wants to speak to MSW tomorrow. Consult     Prior Level of Function:  Prior Level of Function  Home Environment: Single level  Mentation: Oriented, Confused  Mobility: Non-ambulatory  DME: Wheelchair     Complicating Factors/Barriers:Poor mentation    Patient/Family Goal:  Safe DC to SNF        Discharge Planning :Maretta Los, RN

## 2013-12-26 NOTE — Treatment Summary (Signed)
IP PT DAILY NOTE  (NOT SEEN)  Patient name: Scott Dudley    Date of Birth: 1960/10/19 Date: 12/26/2013   Gender: male  MRN: [id number]     Diagnosis:    Patient Active Problem List   Diagnosis SNOMED CT(R)   . Seizure disorder SEIZURE DISORDER   . Hypertension, benign BENIGN HYPERTENSION   . Acute left hemiparesis LEFT HEMIPARESIS   . Acute alcohol intoxication ALCOHOL INTOXICATION   . CVA (cerebral infarction) CEREBRAL INFARCTION   . Alcohol withdrawal syndrome, with delirium ALCOHOL WITHDRAWAL SYNDROME        PRECAUTIONS: High Fall Risk; PSA    SUBJECTIVE  NA      OBJECTIVE  [name] was not seen for the following reason:    Procedure/unavailable: MRI        ASSESSMENT   No Treatment provided.      PLAN  See tomorrow    Therapist/License #: Thelma Lorenzetti W. Sammy Douthitt, PT

## 2013-12-26 NOTE — Plan of Care (Signed)
Problem: Safety  Goal: Patient will be injury free during hospitalization  Assess and monitor vitals signs, neurological status including level of consciousness and orientation. Assess patient's risk for falls and implement fall prevention plan of care and interventions per hospital policy.     Ensure arm band on, uncluttered walking paths in room, adequate room lighting, call light and overbed table within reach, bed in low position, wheels locked, side rails up per policy, and non-skid footwear provided.   Outcome: Progressing  Bed in low position, wheels locked. Pt belongings and call light  Within reach

## 2013-12-26 NOTE — Progress Notes (Signed)
Daily Progress Note     Name: Scott Dudley  DOB: [birth year] 53 y.o.  MRN: [id number]  CSN: [id number]    Assessment & Plan:     Active Hospital Problems    Diagnosis SNOMED CT(R) Date Noted   . Alcohol withdrawal syndrome, with delirium ALCOHOL WITHDRAWAL SYNDROME 12/25/2013   . Seizure disorder SEIZURE DISORDER 12/22/2013   . Hypertension, benign BENIGN HYPERTENSION 12/22/2013   . Acute left hemiparesis LEFT HEMIPARESIS 12/22/2013   . Acute alcohol intoxication ALCOHOL INTOXICATION 12/22/2013   . 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 male patient. Patient was admitted with left sided facial droop and fixed gaze to the left side after suffering a seizure in the setting of chronic ETOH use - today remains confused with elevated CIWA however better than yesterday    Scheduled Medications:   . aspirin  324 mg Oral Daily   . atorvastatin  40 mg Oral Nightly   . chlordiazePOXIDE  25 mg Oral Q6H   . escitalopram oxalate  10 mg Oral Daily   . haloperidol lactate  2 mg Intravenous Once   . levETIRAcetam  500 mg Oral TID   . lisinopril  10 mg Oral Daily   . nystatin   Topical 2 times per day   . sodium chloride 0.9 % (NS) syringe  10 mL Intravenous Q8H SCH     Infusions:    PRN Medications: acetaminophen, haloperidol, hydrALAZINE, LORazepam **OR** LORazepam **OR**  LORazepam **OR** LORazepam, magnesium hydroxide, ondansetron, polyethylene glycol, sodium chloride 0.9 % (NS) syringe    Objective:     Vital Signs:    Vital Sign Ranges for Last 24 Hours:  BP  Min: 142/83  Max: 167/91  Temp  Min: 36.3 ?C (97.3 ?F)  Max: 37.1 ?C (98.8 ?F)  Pulse  Min: 67  Max: 96  Resp  Min: 16  Max: 20  SpO2  Min: 95 %  Max: 98 %    Most Recent Vitals  BP: 142/83 mmHg  Heart Rate: 69  Resp: 18  Temp: 36.3 ?C (97.4 ?F)  SpO2: 97 % (12/22 1200)    Intake/Ouput:    Intake/Output Summary (Last 24 hours) at 12/26/13 1458  Last data filed at 12/26/13 0943   Gross per 24 hour   Intake   1795 ml   Output    600 ml   Net   1195 ml        Exam:  Awake, alert, NAD. Orientated to person, place, situation. Lung: CTA. CV: s1/s2 no MRG, BLE with no edema. GI: + btx4, non-distended, non-tender, no masses. MS: mild tremor noted at rest, BUE 4/5 strength, MAE, no facial droop  Recent Labs:  Results for orders placed or performed during the hospital encounter of 12/22/13 (from the past 24 hour(s))   CBC with Auto Differential -Now and AM    Collection Time: 12/26/13  4:10 AM   Result Value Ref Range    WBC 7.4 4.8 - 10.8 K/microL    RBC 4.66 (L) 4.70 - 6.10 M/microL    Hgb 16.3 14.0 - 18.0 gm/dL    HCT 16.1 09.6 - 04.5 %    MCV 101.4 (H) 81.0 - 99.0 fL    MCH 35.0 (H) 27.0 - 34.0 pg    MCHC 34.5 32.0 - 36.0 gm/dL    RDW 40.9 81.1 - 91.4 %    Platelet Count 140 (L) 150 - 400 K/microL    MPV 8.8 7.4 - 10.4 fL    Differential Type AUTO     Segs (Neutrophils)% 62 35 - 70 %    Lymphocytes % 20 (L) 25 - 45 %    Monocytes % 15 (H) 0 - 12 %    Eosinophils % 2 0 - 7 %    Basophils % 1 0 - 2 %    Segs (Neutrophils), Absolute 4.7 1.6 - 7.3 K/microL    Lymphocytes, Absolute 1.5 1.1 - 4.3 K/microL    Monocytes, Absolute 1.1 0 - 1.2 K/microL    Eosinophils, Absolute 0.2 0 - 0.7 K/microL    Basophils, Absolute 0.1 0 - 0.2 K/microL   Basic Metabolic Panel -Now and AM    Collection Time: 12/26/13  4:10 AM   Result Value Ref Range       Sodium 130 (L) 135 - 143 mmol/L    Potassium 4.2 3.5 - 5.1 mmol/L    Chloride 100 98 - 111 mmol/L    CO2 - Carbon Dioxide 23.0 21.0 - 31.0 mmol/L    Glucose 89 80 - 99 mg/dL    BUN 4 (L) 6.0 - 78.2 mg/dL    Creatinine, Serum 9.56 0.64 - 1.27 mg/dL    Calcium 9.1 8.6 - 21.3 mg/dL    Anion Gap 7.0 3 - 11 mmol/L    GFR Estimate >60 >60 mL/min/1.73sq.m    GFR Additional Info                                                      Pending Labs     Order Current Status    Urine Culture Indicated/Ord w/Urinalysis -STAT Collected (12/25/13 1015)        Recent Imaging:  Mri Angiogram Head / Mri Brain Wo Contrast    12/26/2013   IMPRESSION:  1.  Severely compromised exam given severe motion artifact. 2.  Old infarction right frontal lobe. 3.  Mild atrophy. 4.  Patent anterior and posterior circulation with no occlusive disease visible.       LOS: 4 days     [name]  12/26/2013  2:58 PM

## 2013-12-26 NOTE — Treatment Summary (Signed)
IP PT DAILY NOTE  (NOT SEEN)  Patient name: Scott Dudley    Date of Birth: 1960/06/25 Date: 12/26/2013   Gender: male  MRN: [id number]     Diagnosis:    Patient Active Problem List   Diagnosis SNOMED CT(R)   . Seizure disorder SEIZURE DISORDER   . Hypertension, benign BENIGN HYPERTENSION   . Acute left hemiparesis LEFT HEMIPARESIS   . Acute alcohol intoxication ALCOHOL INTOXICATION   . CVA (cerebral infarction) CEREBRAL INFARCTION   . Alcohol withdrawal syndrome, with delirium ALCOHOL WITHDRAWAL SYNDROME        PRECAUTIONS: Fall risk, seizure precautions, confused and agitated, ETOH     SUBJECTIVE  NA      OBJECTIVE  ASHAD IMEL was not seen for the following reason:    Other: Pt sleeping and PSA present in the room. She will page when he wakes. Yesterday he was unable to follow any commands or directions.         ASSESSMENT   No Treatment provided.      PLAN  PAS will page when he wakes up and we can assess if he can follow directions.    Therapist/License #: Chidiebere Wynn W. Kin Galbraith, PT

## 2013-12-26 NOTE — Plan of Care (Signed)
Problem: Safety  Goal: Patient will be injury free during hospitalization  Assess and monitor vitals signs, neurological status including level of consciousness and orientation. Assess patient's risk for falls and implement fall prevention plan of care and interventions per hospital policy.     Ensure arm band on, uncluttered walking paths in room, adequate room lighting, call light and overbed table within reach, bed in low position, wheels locked, side rails up per policy, and non-skid footwear provided.   Outcome: Progressing  Patient is able to sit at bedside this evening without difficulty.  Thinking is noted to be much clearer    Problem: Anxiety  Goal: Anxiety is at manageable level  Assess and monitor patient's anxiety level. Monitor for signs and symptoms of anxiety both physical and emotional (heart palpitations, chest pain, shortness of breath, headaches, nausea, feeling jumpy, restlessness, irritable, apprehensive). Collaborate with interdisciplinary team and initiate plan and interventions as ordered.   Outcome: Progressing  Patient is calm and cooperative

## 2013-12-27 LAB — COMPREHENSIVE METABOLIC PANEL
ALT - Alanine Amino transferase: 49 IU/L (ref 7–52)
AST - Aspartate Aminotransferase: 42 IU/L (ref 10–50)
Albumin/Globulin Ratio: 1.3 (ref >0.9)
Albumin: 3.9 gm/dL (ref 3.5–5.0)
Alkaline Phosphatase: 87 IU/L (ref 34–104)
Anion Gap: 6 mmol/L (ref 3–11)
BUN: 8 mg/dL (ref 6.0–23.0)
Bilirubin, Total: 0.9 mg/dL (ref 0.3–1.2)
CO2 - Carbon Dioxide: 27 mmol/L (ref 21.0–31.0)
Calcium: 9.3 mg/dL (ref 8.6–10.3)
Chloride: 98 mmol/L (ref 98–111)
Creatinine, Serum: 0.75 mg/dL (ref 0.65–1.30)
GFR Estimate: >60 mL/min/{1.73_m2} (ref >60)
Globulin: 2.9 gm/dL (ref 2.2–3.7)
Glucose: 88 mg/dL (ref 80–99)
Potassium: 4.3 mmol/L (ref 3.5–5.1)
Protein, Total: 6.8 gm/dL (ref 6.0–8.0)
Sodium: 131 mmol/L — ABNORMAL LOW (ref 135–143)

## 2013-12-27 LAB — CBC WITH AUTO DIFFERENTIAL
Basophils %: 1 % (ref 0–2)
Basophils, Absolute: 0.06 10*3/uL (ref 0–0.20)
Eosinophils %: 1 % (ref 0–7)
Eosinophils, Absolute: 0.06 10*3/uL (ref 0–0.70)
HCT: 46.4 % (ref 42.0–54.0)
Hgb: 16.1 gm/dL (ref 14.0–18.0)
Lymphocytes %: 35 % (ref 25–45)
Lymphocytes, Absolute: 2 10*3/uL (ref 1.10–4.30)
MCH: 35.6 pg — ABNORMAL HIGH (ref 27.0–34.0)
MCHC: 34.8 gm/dL (ref 32.0–36.0)
MCV: 102.5 fL — ABNORMAL HIGH (ref 81.0–99.0)
MPV: 9.7 fL (ref 7.4–10.4)
Monocytes %: 15 % — ABNORMAL HIGH (ref 0–12)
Monocytes, Absolute: 0.86 10*3/uL (ref 0–1.20)
Platelet Count: 139 10*3/uL — ABNORMAL LOW (ref 150–400)
RBC: 4.52 10*6/uL — ABNORMAL LOW (ref 4.70–6.10)
RDW: 13.9 % (ref 11.5–14.5)
Segs %: 48 % (ref 35–70)
Segs, Absolute: 2.72 10*3/uL (ref 1.60–7.30)
WBC: 5.7 10*3/uL (ref 4.8–10.8)

## 2013-12-27 LAB — KEPPRA (LEVETIRACETAM): Keppra (Levetiracetam): 10.8 ug/mL — ABNORMAL LOW (ref 12.0–46.0)

## 2013-12-27 MED ORDER — lisinopril (PRINIVIL,ZESTRIL) tablet 20 mg
10 | Freq: Every day | ORAL | Status: DC
Start: 2013-12-27 — End: 2013-12-28
  Administered 2013-12-28: 16:00:00 10 mg via ORAL

## 2013-12-27 MED ORDER — lisinopril (PRINIVIL,ZESTRIL) tablet 10 mg
10 | Freq: Once | ORAL | Status: AC
Start: 2013-12-27 — End: 2013-12-27
  Administered 2013-12-28: 03:00:00 10 mg via ORAL

## 2013-12-27 MED ORDER — chlordiazePOXIDE (LIBRIUM) capsule 25 mg
25 | Freq: Two times a day (BID) | ORAL | Status: DC
Start: 2013-12-27 — End: 2013-12-28
  Administered 2013-12-28 (×2): 25 mg via ORAL

## 2013-12-27 MED ORDER — chlordiazePOXIDE (LIBRIUM) capsule 25 mg
25 | Freq: Four times a day (QID) | ORAL | Status: DC
Start: 2013-12-27 — End: 2013-12-27
  Administered 2013-12-27 (×4): 25 mg via ORAL

## 2013-12-27 MED ORDER — LORazepam (ATIVAN) injection 1 mg
2 | INTRAMUSCULAR | Status: DC | PRN
Start: 2013-12-27 — End: 2013-12-28

## 2013-12-27 MED FILL — LEVETIRACETAM 500 MG TABLET: 500 mg | ORAL | Qty: 1

## 2013-12-27 MED FILL — ASPIRIN 81 MG CHEWABLE TABLET: 81 mg | ORAL | Qty: 4

## 2013-12-27 MED FILL — CHLORDIAZEPOXIDE 25 MG CAPSULE: 25 mg | ORAL | Qty: 1

## 2013-12-27 MED FILL — FOLIC ACID 1 MG TABLET: 1 mg | ORAL | Qty: 1

## 2013-12-27 MED FILL — VITAMIN B-1 100 MG TABLET: 100 mg | ORAL | Qty: 1

## 2013-12-27 MED FILL — ATORVASTATIN 40 MG TABLET: 40 mg | ORAL | Qty: 1

## 2013-12-27 MED FILL — THERA-M 27 MG-0.4 MG TABLET: 27-0.4 mg | ORAL | Qty: 1

## 2013-12-27 MED FILL — ESCITALOPRAM 10 MG TABLET: 10 mg | ORAL | Qty: 1

## 2013-12-27 MED FILL — LISINOPRIL 10 MG TABLET: 10 mg | ORAL | Qty: 1

## 2013-12-27 NOTE — Discharge Planning (AHS/AVS) (Signed)
Spoke with patient and spouse. Both gave permission for medicaid application. Talked to the pt about choosing a SNF. He agrees to go but wants to talk to the spouse about it more. Spouse will be in today and will speak to her and pt together and they will decide which one.

## 2013-12-27 NOTE — Discharge Planning (AHS/AVS) (Signed)
Pt and wife together spoke with DCP and have talked about SNF. Pt agrees to [facility] per wife suggestion. Clinicals sent over by case manager.

## 2013-12-27 NOTE — Plan of Care (Signed)
Problem: Knowledge Deficit  Goal: Patient/family/caregiver demonstrates understanding of disease process, treatment plan, medications, and discharge instructions  Complete learning assessment and assess knowledge base.   Outcome: Progressing  Pt is confused but wife has been contacted and updated with plan of care and will continue to be updated with changes. Wife spoke at length with Sharen Hint yesterday.      Problem: Safety  Goal: Patient will be injury free during hospitalization  Assess and monitor vitals signs, neurological status including level of consciousness and orientation. Assess patient's risk for falls and implement fall prevention plan of care and interventions per hospital policy.     Ensure arm band on, uncluttered walking paths in room, adequate room lighting, call light and overbed table within reach, bed in low position, wheels locked, side rails up per policy, and non-skid footwear provided.   Outcome: Progressing  Pt continues to have a PSA at bedside.

## 2013-12-27 NOTE — Treatment Summary (Signed)
Physical Therapy Daily Note    Patient name:  Scott Dudley    Date:  12/27/2013    Primary Diagnosis:   Patient Active Problem List   Diagnosis SNOMED CT(R)   . Seizure disorder SEIZURE DISORDER   . Hypertension, benign BENIGN HYPERTENSION   . Acute left hemiparesis LEFT HEMIPARESIS   . Acute alcohol intoxication ALCOHOL INTOXICATION   . CVA (cerebral infarction) CEREBRAL INFARCTION   . Alcohol withdrawal syndrome, with delirium ALCOHOL WITHDRAWAL SYNDROME       PRECAUTIONS: High Fall Risk, Left Inattention      SUBJECTIVE     Pt was agreeable to PT/OT Session/Evaluation.  Pt was cooperative and calm.    Pain:   Pain does not limit patient's functional ability at present.      OBJECTIVE      Appliances:  Tele, IV, floor mats    Mental Status/LOC/Orientation:  AxOx self, circumstances    Bed Mobility: Independent    Transfers: SBA    Gait:   Scott Dudley walked 175 feet with FWW and CGA assist. Then he  walked 175 feet without FWW and Min assist.    Gait Issues/Limitations: Tends to lean forward too far forward over the walker and bump into things on the left; Without the FWW, he walks faster to try to maintain his upper body position.    Vitals: No significant changes in vital signs noted.      Safety:  Scott Dudley left in room in Chair with call light /phone within reach.  Chair Alarm on.  Instructed in benefits of increased out of bed time.  Encouraged patient to be out of bed for 2-4 hours.       ASSESSMENT    Pt is more clear cognitively than the last few days.  Pt is NOT safe for home and would benefit from NH for Rehab with OT/PT.    Discharge Recommendation: NH      PLAN     Progress gait.    Frequency: 1x/day (M-F)   1x/day (Saturday or Sunday)    Patient/Caregiver GOALS reviewed and integrated with rehab treatment plan:  Multidisciplinary Problems (Active)        Problem: IP General Patient Problem List    Goal Priority Disciplines Outcome   Bed Mobility     PT    Description:  Patient to be independent  with bed mobility, following any pertinent precautions.    Transfers     PT    Description:  Patient to perform all functional transfers independently.    Ambulation - Levels     PT    Description:  Patient to ambulate independently x 200 feet with appropriate assistive device.                Scott Dudley will remain on the Physical Therapy schedule until goals are met, or there has been a change in the plan of care.      Therapist/License #: Diannie Willner W. Shaquera Ansley, PT    Start Time:  1000 /Stop Time:  1030

## 2013-12-27 NOTE — Discharge Planning (AHS/AVS) (Signed)
I have faxed clinical documentation to [facility].   Elicia Lamp, Case Management Specialist.

## 2013-12-27 NOTE — Progress Notes (Signed)
Daily Progress Note     Name: Scott Dudley  DOB: [birth year] 53 y.o.  MRN: [id number]  CSN: [id number]    Assessment & Plan:     Active Hospital Problems    Diagnosis SNOMED CT(R) Date Noted   . Alcohol withdrawal syndrome, with delirium ALCOHOL WITHDRAWAL SYNDROME 12/25/2013   . Seizure disorder SEIZURE DISORDER 12/22/2013   . Hypertension, benign BENIGN HYPERTENSION 12/22/2013   . Acute left hemiparesis LEFT HEMIPARESIS 12/22/2013   . Acute alcohol intoxication ALCOHOL INTOXICATION 12/22/2013   . 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 male patient. Patient was admitted with left sided facial droop and fixed gaze to the left side after suffering a seizure in the setting of chronic ETOH use - today the neurological deficit has resolved - mentation improving and few signs of W/D    Scheduled Medications:   . aspirin  324 mg Oral Daily   . atorvastatin  40 mg Oral Nightly   . chlordiazePOXIDE  25 mg Oral BID   . escitalopram oxalate  10 mg Oral Daily   . folic acid  1 mg Oral Daily   . haloperidol lactate  2 mg Intravenous Once   . levETIRAcetam  500 mg Oral TID   . lisinopril  10 mg Oral Once   . [START ON 12/28/2013] lisinopril  20 mg Oral Daily   . multivitamin w/iron-minerals  1 tablet Oral Daily   .  nystatin   Topical 2 times per day   . sodium chloride 0.9 % (NS) syringe  10 mL Intravenous Q8H SCH   . thiamine  100 mg Oral Daily     Infusions:    PRN Medications: acetaminophen, hydrALAZINE, LORazepam, LORazepam **OR** [DISCONTINUED] LORazepam **OR** LORazepam **OR** [DISCONTINUED] LORazepam, magnesium hydroxide, ondansetron, polyethylene glycol, sodium chloride 0.9 % (NS) syringe    Objective:     Vital Signs:    Vital Sign Ranges for Last 24 Hours:  BP  Min: 141/90  Max: 184/96  Temp  Min: 36.4 ?C (97.5 ?F)  Max: 37.3 ?C (99.2 ?F)  Pulse  Min: 74  Max: 105  Resp  Min: 16  Max: 20  SpO2  Min: 95 %  Max: 99 %    Most Recent Vitals  BP: 141/90 mmHg  Heart Rate: 81  Resp: 20  Temp: 36.7 ?C (98 ?F)  SpO2: 99 % (12/23 1219)    Intake/Ouput:    Intake/Output Summary (Last 24 hours) at 12/27/13 1511  Last data filed at 12/27/13 0600   Gross per 24 hour   Intake    900 ml   Output    650 ml  Net    250 ml        Exam:  Awake, alert, NAD. Lung: CTA. CV: s1/s2 no MRG, BLE with no edema. GI: + btx4, non-distended, non-tender, no masses. Neuro: unsteady gait, mild tremor in BUE, no unilateral weakness    Recent Labs:  Results for orders placed or performed during the hospital encounter of 12/22/13 (from the past 24 hour(s))   CBC with Auto Differential -AM Draw    Collection Time: 12/27/13  6:15 AM   Result Value Ref Range    WBC 5.7 4.8 - 10.8 K/microL    RBC 4.52 (L) 4.70 - 6.10 M/microL    Hgb 16.1 14.0 - 18.0 gm/dL    HCT 60.4 54.0 - 98.1 %    MCV 102.5 (H) 81.0 - 99.0 fL    MCH 35.6 (H) 27.0 - 34.0 pg    MCHC 34.8 32.0 - 36.0 gm/dL    RDW 19.1 47.8 - 29.5 %    Platelet Count 139 (L) 150 - 400 K/microL    MPV 9.7 7.4 - 10.4 fL    Segs % 48 35 - 70 %    Lymphocytes % 35 25 - 45 %    Monocytes % 15 (H) 0 - 12 %    Eosinophils % 1 0 - 7 %    Basophils % 1 0 - 2 %    Segs, Absolute 2.72 1.60 - 7.30 K/microL    Lymphocytes, Absolute 2.00 1.10 - 4.30 K/microL    Monocytes, Absolute 0.86 0 - 1.20 K/microL    Eosinophils,  Absolute 0.06 0 - 0.70 K/microL    Basophils, Absolute 0.06 0 - 0.20 K/microL    Differential Type MICRO     Platelet Estimate DEC (A) NORMAL    RBC Morphology 1+ MACRO (A) NORMAL   Comprehensive Metabolic Panel -AM Draw    Collection Time: 12/27/13  6:15 AM   Result Value Ref Range    Sodium 131 (L) 135 - 143 mmol/L    Potassium 4.3 3.5 - 5.1 mmol/L    Chloride 98 98 - 111 mmol/L    CO2 - Carbon Dioxide 27.0 21.0 - 31.0 mmol/L    Glucose 88 80 - 99 mg/dL    BUN 8 6.0 - 62.1 mg/dL    Creatinine, Serum 3.08 0.65 - 1.30 mg/dL    Calcium 9.3 8.6 - 65.7 mg/dL    AST - Aspartate Aminotransferase 42 10 - 50 IU/L    ALT - Alanine Amino transferase 49 7 - 52 IU/L    Alkaline Phosphatase 87 34 - 104 IU/L    Bilirubin, Total 0.9 0.3 - 1.2 mg/dL    Protein, Total 6.8 6.0 - 8.0 gm/dL    Albumin 3.9 3.5 - 5.0 gm/dL    Globulin 2.9 2.2 - 3.7 gm/dL    Albumin/Globulin Ratio 1.3 >0.9    Anion Gap 6.0 3 - 11 mmol/L    GFR Estimate >60 >60 mL/min/1.73sq.m    GFR Additional Info                                                      Pending Labs     Order Current Status    Urine Culture Indicated/Ord w/Urinalysis -STAT Collected (12/25/13 1015)    Keppra (Levetiracetam) -AM Draw In process  Recent Imaging:  No results found.     LOS: 5 days     [name]  12/27/2013  3:11 PM

## 2013-12-27 NOTE — Progress Notes (Addendum)
Nutrition Assessment -- Moderate Risk    53__ yo male  Admit Dx: ETOH withdrawal with delirium, HTN, acute left hemiparesis, acute ETOH intoxication, CVA  PMH includes: Seizures, HTN, Acute left hemiparesis    Ht: 67  Wt: 92.9 kg (204 lbs 12oz)   BMI: 32.06 (Obese Range)     Diet: General  Intakes: < 50%  Allergies: NKFA  Last BM: 12/26/13  x2    Labs: (12/23)RBC 4.52, MCV 102.5, Na 131  Meds include: Lipitor, Folic acid, Vitamin B-1, zofran    Assessment:   Unable to wake pt during attempted visit.  Spoke to CNA concerning pts intake and she stated that he refused breakfast today and ate approximately 25% of his lunch tray.  Will add a supplement to all meal trays and have diet clerk assist with meal selections to try and increase po.    Nutrition Intervention:  1. Add boost plus to all meal trays  2. Have diet clerk assist with meal selections

## 2013-12-27 NOTE — Other (Signed)
Occupational Therapy     OT Evaluation/ Plan of Care Note  Patient name: Scott Dudley  Date:  12/27/2013    PATIENT INFORMATION  Primary Diagnosis:   Acute left hemiparesis    Surgery:       Days Post surgery:       Pertinent Medical/Surgical Hx:    Past Medical History   Diagnosis Date   . Seizures    . Hypertension    . Acute left hemiparesis 12/22/2013     Past Surgical History   Procedure Laterality Date   . Knee surgery       Patient Active Problem List   Diagnosis SNOMED CT(R)   . Seizure disorder SEIZURE DISORDER   . Hypertension, benign BENIGN HYPERTENSION   . Acute left hemiparesis LEFT HEMIPARESIS   . Acute alcohol intoxication ALCOHOL INTOXICATION   . CVA (cerebral infarction) CEREBRAL INFARCTION   . Alcohol withdrawal syndrome, with delirium ALCOHOL WITHDRAWAL SYNDROME         Precautions:  Fall prec. Inattention to L     Hx of Onset:  Pt. Admitted after having stroke like symptoms. Hx of old infarct. Later dx with todd's palsy     PLOF:  Pt. Lives with [name] and she works during the day the pt does not work due to head injury aquired when he was 55.     Social Situation:  The pt. Used to work Journalist, newspaper Stairs:   level     Equipment:  none      SUBJECTIVE    Pt. Is willing to work with OT     Pain: Pain does not limit patient's functional ability at present.     Pre Treatment Pain Level:  0/10   Pain Level during activity:  0/10   Post Treatment Pain Level:  0/10       OBJECTIVE  Appliances:  IV site    Observation:  Pt. Coming into sitting on EOB as nurse is in room placing chair alarm on recliner chair.    Mental Status/LOC/Orientation:  AxOx3.  Decreased insight into circumstances    Sensation:  Functional for extremities.  Pt. Has decreased hearing, no olfactory sense and decreased vision on the L all from previous head injury.  He feels the inattention is a little more      Strength:  Functional     ROM:  Functional     Balance:  Sit Static: N  Sit Dynamic:  N  Stand Static: F  Stand Dynamic: F    Bed Mobility: ind      Transfers:  CGA    Functional Mobility: use of FWW     Functional Limitations: slow processing, poor balance, poor activity tolerance    ADL's: feeding self    Endurance:  fair    Vitals: No significant changes in vital signs noted.       Today's Treatment:  Educ. Pt on purpose of OT and purpose of rehab        ASSESSMENT  53 yo male admitted for stroke like symptoms  Decreased balance  Overall min a for all ADL   Not safe at home alone    Rehab Potential:   good    Discharge Recommendations: SNF for rehab      PLAN    Interventions:    ADL Training     Therapeutic Exercise     Functional Mobility  Patient/Caregiver Training     Muscle Re-education    DME information    Needed devices addressed  Frequency:  5X/week  Mon-Fri.  Duration:  Inpatient stay or when goals met    Therapist Goals (by discharge)  Multidisciplinary Problems (Active)        Problem: ADLs    Goal Priority Disciplines Outcome   ADLs General     OT    Description:  Patient to perform all self care sitting or standing AE/AD PRN  with set up assistance and no LOB           Problem: Cognitive Skills    Goal Priority Disciplines Outcome   Impaired Cognition     OT    Description:  Pt. to be able to complete 3 step task with 1 verbal cue to begin assist.           Problem: Safety    Goal Priority Disciplines Outcome   Awareness     OT    Description:  Pt. will demonstrate awareness of safety by proper use of AD in small spaces and no LOB .                Patient/Family Goal:  SNF    Therapist/License #Loann Quill, OT/L [id number]   /   Date: 12/27/2013  Start Time:  1000 /Stop Time:  1115

## 2013-12-27 NOTE — Other (Addendum)
Received SW referral Medicaid application. Pt gives verbal permission as does wife. Reviewed medical chart. Noted ETOH use in chart. Met with pt. Pt was alert and oriented. Used the CAGE Evaluation Assessment to assess ETOH use. Pt answers the following questions as indicated. Pt reports that he drinks about a 5th per day.      1. Have you ever felt you should cut down on your drinking? Yes  2. Have people annoyed you by criticizing your drinking? Yes  3. Have you ever felt bad or guilty about your drinking?  No  4. Have you ever had a drink first thing in the morning to steady your nerves or to get rid of a hangover (eye-opener)? No    Pt reports that he completely independent with his ADLs. Consulted with DCP Kim who reports that pt reported to her and other staff that he was not independent with ADLs. Pt reports an income of $1,600.     Met with pt's wife who reports pt has a lot of memory issues. She states that pt will wonder around town, leaves the stove , etc. She states he cant be home alone anymore. She works and during that time is when pt is alone.     Pt did get SW verbal to request in home service with the [facility] office. Made a referral to [facility].    SW will continue to follow.

## 2013-12-28 LAB — BASIC METABOLIC PANEL
Anion Gap: 7 mmol/L (ref 3–11)
BUN: 11 mg/dL (ref 6.0–23.0)
CO2 - Carbon Dioxide: 26 mmol/L (ref 21.0–31.0)
Calcium: 9.2 mg/dL (ref 8.6–10.3)
Chloride: 98 mmol/L (ref 98–111)
Creatinine, Serum: 0.76 mg/dL (ref 0.65–1.30)
GFR Estimate: >60 mL/min/{1.73_m2} (ref >60)
Glucose: 97 mg/dL (ref 80–99)
Potassium: 4.3 mmol/L (ref 3.5–5.1)
Sodium: 131 mmol/L — ABNORMAL LOW (ref 135–143)

## 2013-12-28 MED ORDER — LORazepam (ATIVAN) 1 MG tablet
1 | ORAL_TABLET | ORAL | 0.00 refills | 15.00000 days | Status: AC | PRN
Start: 2013-12-28 — End: ?

## 2013-12-28 MED ORDER — thiamine (VITAMIN B-1) 100 MG tablet
100 | ORAL_TABLET | Freq: Every day | ORAL | Status: AC
Start: 2013-12-28 — End: 2014-01-27

## 2013-12-28 MED ORDER — atorvastatin (LIPITOR) 40 MG tablet
40 | ORAL_TABLET | Freq: Every evening | ORAL | 2.00 refills | 90.00000 days | Status: AC
Start: 2013-12-28 — End: 2014-01-27

## 2013-12-28 MED ORDER — folic acid (FOLVITE) 1 MG tablet
1 | ORAL_TABLET | Freq: Every day | ORAL | 0.00 refills | 30.00000 days | Status: AC
Start: 2013-12-28 — End: 2014-01-27

## 2013-12-28 MED ORDER — chlordiazePOXIDE (LIBRIUM) 25 MG capsule
25 | ORAL_CAPSULE | ORAL | 0.00 refills | 2.00000 days | Status: AC
Start: 2013-12-28 — End: ?

## 2013-12-28 MED ORDER — acetaminophen (TYLENOL) 325 mg tablet
325 | ORAL | Status: AC | PRN
Start: 2013-12-28 — End: 2014-01-07

## 2013-12-28 MED ORDER — multivitamin w/iron-minerals (THERA-M) 27-0.4 mg Tab
27-0.4 | ORAL_TABLET | Freq: Every day | ORAL | 3.00 refills | 40.00000 days | Status: AC
Start: 2013-12-28 — End: 2014-01-27

## 2013-12-28 MED ORDER — polyethylene glycol (MIRALAX) 17 gram packet
17 | Freq: Every day | ORAL | 0.00 refills | 15.00000 days | Status: AC | PRN
Start: 2013-12-28 — End: 2014-01-11

## 2013-12-28 MED ORDER — aspirin 81 MG chewable tablet
81 | ORAL_TABLET | Freq: Every day | ORAL | Status: AC
Start: 2013-12-28 — End: 2014-01-27

## 2013-12-28 MED FILL — LISINOPRIL 10 MG TABLET: 10 mg | ORAL | Qty: 1

## 2013-12-28 MED FILL — LEVETIRACETAM 500 MG TABLET: 500 mg | ORAL | Qty: 1

## 2013-12-28 MED FILL — CHLORDIAZEPOXIDE 25 MG CAPSULE: 25 mg | ORAL | Qty: 1

## 2013-12-28 MED FILL — ASPIRIN 81 MG CHEWABLE TABLET: 81 mg | ORAL | Qty: 4

## 2013-12-28 MED FILL — LORAZEPAM 1 MG TABLET: 1 mg | ORAL | Qty: 1

## 2013-12-28 MED FILL — ATORVASTATIN 40 MG TABLET: 40 mg | ORAL | Qty: 1

## 2013-12-28 MED FILL — FOLIC ACID 1 MG TABLET: 1 mg | ORAL | Qty: 1

## 2013-12-28 MED FILL — VITAMIN B-1 100 MG TABLET: 100 mg | ORAL | Qty: 1

## 2013-12-28 MED FILL — ESCITALOPRAM 10 MG TABLET: 10 mg | ORAL | Qty: 1

## 2013-12-28 MED FILL — LISINOPRIL 10 MG TABLET: 10 mg | ORAL | Qty: 2

## 2013-12-28 MED FILL — THERA-M 27 MG-0.4 MG TABLET: 27-0.4 mg | ORAL | Qty: 1

## 2013-12-28 NOTE — Discharge Summary (Signed)
Physician Discharge Summary     Patient ID:  Name: Scott Dudley  DOB: 18-Mar-1960 53 y.o.  MRN: [id number]  CSN: [id number]    Admit date: 12/22/2013    Discharge date: 12/28/2013    Admission Diagnosis: CVA (cerebral infarction)  Left hemiparesis  Left hemiparesis    Discharge Diagnoses:   1) Acute Left Hemiparesis  2) Seizure D/O  3) ETOH W/D  4) Hyponatremia    Resolved Problems     Weakness of left side of body    Fever convulsion        Significant Diagnostic Studies:   1) Brain MRI  2) Head CT  3) Carotid US    Results for orders placed or performed during the hospital encounter of 12/22/13 (from the past 24 hour(s))   Basic Metabolic Panel -Daily    Collection Time: 12/28/13  6:04 AM   Result Value Ref Range    Sodium 131 (L) 135 - 143 mmol/L    Potassium 4.3 3.5 - 5.1 mmol/L    Chloride 98 98 - 111 mmol/L    CO2 - Carbon Dioxide 26.0 21.0 - 31.0 mmol/L    Glucose 97 80 - 99 mg/dL    BUN 11 6.0 - 29.5 mg/dL    Creatinine, Serum 6.21 0.65 - 1.30 mg/dL    Calcium 9.2 8.6 - 30.8 mg/dL    Anion Gap 7.0 3 - 11 mmol/L    GFR Estimate >60 >60 mL/min/1.73sq.m    GFR Additional Info                                                      Mri Angiogram Head / Mri Brain Wo Contrast    12/26/2013 Impression  IMPRESSION:  1.  Severely compromised exam given severe motion artifact. 2.  Old infarction right frontal lobe. 3.  Mild atrophy. 4.  Patent anterior and posterior circulation with no occlusive disease visible.    Procedures/Treatments:   1) None    Consults:   IP CONSULT TO DISCHARGE PLANNER  IP CONSULT TO SOCIAL WORK  IP CONSULT TO SOCIAL WORK    Physical Exam:   Physical Exam   Constitutional: He is oriented to person, place, and time. He appears well-developed and well-nourished.   HENT:   Head: Normocephalic and atraumatic.   Eyes: EOM are normal. Pupils are equal, round, and reactive to light.   Neck: Normal range of motion. Neck supple.   Cardiovascular: Normal rate and regular rhythm.    Pulmonary/Chest: Effort  normal and breath sounds normal.   Abdominal: Soft. Bowel sounds are normal.   Musculoskeletal: Normal range of motion.   Neurological: He is alert and oriented to person, place, and time.   Skin: Skin is warm and dry.   Psychiatric: He has a normal mood and affect. His behavior is normal.       Hospital Course:   Pt presented to the ED for evaluation of left sided weakness and fixed gaze to the left after suffering a seizure in the setting of chronic alcoholism - head CT with extensive old infarction and no new findings - pt was hemodynamically stable - pt was admitted for further care:    1) Acute Left Hemiparesis - noted at admit and resolved spontaneously - head CT with chronic findings - brain MRI with  old infarction in the right frontal lobe - carotid US was negative - pt was placed on tele and showed no evidence of dysrhythmia - pt was treated with ASA / statin - pt received PT and showed general weakness and ataxia without focal findings - BP was controlled with Lisinopril - pt was discharged to a SNF - consider OP echo to complete w/u of old CVA findings       2) Seizure D/O - presenting symptoms in the setting of known seizure d/o on Keppra therapy - Keppra level elevated on admit with repeat pending - no evidence of recurrent seizure - pt does follow with neurologist Dr Lendell Caprice     3) ETOH W/D - started on scheduled Librium, vitamins and PRN Ativan based on CIWA scale - by discharge pt was showing improvement with CIWA of 4 - will continue downward Librium titration, scheduled vitamins and PRN Ativan - encouraged ETOH cessation    4) Hyponatremia - improved with ETOH cessation and IVF      Discharge Medications:     Patient's Discharge Medication List      TAKE these medications       Indications of Use    acetaminophen 325 mg tablet   Commonly known as:  TYLENOL   Take 2 tablets (650 mg total) by mouth every 4 (four) hours as needed for up to 10 days.        aspirin 81 MG chewable tablet   Quantity:   120 tablet   Chew 1 tablet (81 mg total) by mouth daily for 30 days.        atorvastatin 40 MG tablet   Quantity:  30 tablet   Commonly known as:  LIPITOR   Take 1 tablet (40 mg total) by mouth nightly for 30 days.        chlordiazePOXIDE 25 MG capsule   Quantity:  9 capsule   Commonly known as:  LIBRIUM   1 tab bid for 3 days then 1 tab daily for 3 days then stop        escitalopram oxalate 10 MG tablet   Commonly known as:  LEXAPRO   Take 10 mg by mouth daily.        folic acid 1 MG tablet   Quantity:  30 tablet   Commonly known as:  FOLVITE   Take 1 tablet (1 mg total) by mouth daily for 30 days.        levETIRAcetam 500 MG tablet   Commonly known as:  KEPPRA   Take 500 mg by mouth 3 (three) times daily.        lisinopril 10 MG tablet   Commonly known as:  PRINIVIL,ZESTRIL   Take 10 mg by mouth daily.        LORazepam 1 MG tablet   Quantity:  30 tablet   Commonly known as:  ATIVAN   Take 0.5-1 tablets (0.5-1 mg total) by mouth every 4 (four) hours as needed for Anxiety.        multivitamin w/iron-minerals 27-0.4 mg Tab   Quantity:  30 tablet   Commonly known as:  THERA-M   Take 1 tablet by mouth daily for 30 days.        polyethylene glycol 17 gram packet   Quantity:  14 each   Commonly known as:  MIRALAX   Take 1 packet (17 g total) by mouth daily as needed for up to 14 days.  thiamine 100 MG tablet   Quantity:  30 tablet   Commonly known as:  Vitamin B-1   Take 1 tablet (100 mg total) by mouth daily for 30 days.              Patient Instructions/Follow-up Appointments:         Pending Labs     Order Current Status    Urine Culture Indicated/Ord w/Urinalysis -STAT Collected (12/25/13 1015)    Keppra (Levetiracetam) -AM Draw In process        Activity: activity as tolerated  Diet: regular diet  Discharge Disposition: to SNF  Follow-up with Dr Emelda Brothers in 1 week     LOS: 6 days   Time spent on discharge: 35 min    Signed:  [name]  12/28/2013  11:37 AM

## 2013-12-28 NOTE — Plan of Care (Signed)
Problem: Knowledge Deficit  Goal: Patient/family/caregiver demonstrates understanding of disease process, treatment plan, medications, and discharge instructions  Complete learning assessment and assess knowledge base.   Outcome: Not Progressing  Pt with confusion, agitation. Unable to verbalize understanding of teaching or plan of care effectively.    Problem: Safety  Goal: Patient will be injury free during hospitalization  Assess and monitor vitals signs, neurological status including level of consciousness and orientation. Assess patient's risk for falls and implement fall prevention plan of care and interventions per hospital policy.     Ensure arm band on, uncluttered walking paths in room, adequate room lighting, call light and overbed table within reach, bed in low position, wheels locked, side rails up per policy, and non-skid footwear provided.   Outcome: Progressing  Bed in lowest position and locked, call light in reach. Room free of clutter, adequate lighting. Bed alarm is on. Patient wearing threaded socks and assisted with ambulation. Pt verbalizes understanding of teaching regarding fall precautions.

## 2013-12-28 NOTE — Discharge Planning (AHS/AVS) (Signed)
Pt DC to [facility] in Stable condition     12/28/13 1700   Discharge Disposition   Discharge Disposition SNF   Nursing Homes   Nursing Home List Provided [name] to patient;Given to family   Payer Source Medicare   ALOC Signed and Given (by wife)   Chart Notes Faxed By Case Manager   Facility/ies Faxed To  ([facility])   Accepting Facility/ies [facility]   SNF - Receiving Facility  [facility]   ICF - Receiving Facility  [facility]   PASRR Completed Yes   Physician (POLST completed) (done)   Lobbyist Private Vehicle   Scheduled Transportation Date 12/28/13   Scheduled Transportation Time 1725   .

## 2013-12-28 NOTE — Discharge Planning (AHS/AVS) (Addendum)
Per Discharge Planner, Maretta Los, RN.  Sent new referral via [employer] to [facility].  Les Pou, Case Management Specialist.         Patient will be discharging today to [facility].  Orders have been copied and originals have been placed in envelope. Orders were also faxed to [facility].

## 2014-03-19 ENCOUNTER — Inpatient Hospital Stay: Payer: MEDICARE

## 2014-03-19 DIAGNOSIS — L723 Sebaceous cyst: Secondary | ICD-10-CM

## 2014-03-19 LAB — WOUND CULTURE, SUPERFICIAL: Gram Stain Result: NONE SEEN

## 2014-07-17 ENCOUNTER — Encounter (HOSPITAL_COMMUNITY): Payer: Self-pay | Admitting: Emergency Medicine

## 2014-07-17 ENCOUNTER — Observation Stay (HOSPITAL_COMMUNITY)
Admission: EM | Admit: 2014-07-17 | Discharge: 2014-07-18 | Disposition: A | Discharge status: Home / Self Care | Payer: Medicare Other | Attending: Internal Medicine | Admitting: Internal Medicine

## 2014-07-17 ENCOUNTER — Observation Stay (HOSPITAL_COMMUNITY): Payer: Medicare Other

## 2014-07-17 DIAGNOSIS — F17220 Nicotine dependence, chewing tobacco, uncomplicated: Secondary | ICD-10-CM | POA: Insufficient documentation

## 2014-07-17 DIAGNOSIS — G40509 Epileptic seizures related to external causes, not intractable, without status epilepticus: Secondary | ICD-10-CM | POA: Diagnosis not present

## 2014-07-17 DIAGNOSIS — J320 Chronic maxillary sinusitis: Secondary | ICD-10-CM | POA: Insufficient documentation

## 2014-07-17 DIAGNOSIS — Z8673 Personal history of transient ischemic attack (TIA), and cerebral infarction without residual deficits: Secondary | ICD-10-CM | POA: Diagnosis not present

## 2014-07-17 DIAGNOSIS — Z79899 Other long term (current) drug therapy: Secondary | ICD-10-CM | POA: Diagnosis not present

## 2014-07-17 DIAGNOSIS — F1010 Alcohol abuse, uncomplicated: Secondary | ICD-10-CM | POA: Diagnosis not present

## 2014-07-17 DIAGNOSIS — E663 Overweight: Secondary | ICD-10-CM | POA: Diagnosis not present

## 2014-07-17 DIAGNOSIS — Z87820 Personal history of traumatic brain injury: Secondary | ICD-10-CM | POA: Insufficient documentation

## 2014-07-17 DIAGNOSIS — Z6829 Body mass index (BMI) 29.0-29.9, adult: Secondary | ICD-10-CM | POA: Insufficient documentation

## 2014-07-17 DIAGNOSIS — R569 Unspecified convulsions: Secondary | ICD-10-CM | POA: Diagnosis not present

## 2014-07-17 DIAGNOSIS — R740 Nonspecific elevation of levels of transaminase and lactic acid dehydrogenase [LDH]: Secondary | ICD-10-CM | POA: Diagnosis not present

## 2014-07-17 HISTORY — DX: Unspecified convulsions: R56.9

## 2014-07-17 HISTORY — DX: Unspecified intracranial injury with loss of consciousness status unknown, initial encounter: S06.9XAA

## 2014-07-17 HISTORY — DX: Unspecified intracranial injury with loss of consciousness of unspecified duration, initial encounter: S06.9X9A

## 2014-07-17 HISTORY — DX: Alcohol abuse, uncomplicated: F10.10

## 2014-07-17 HISTORY — DX: Cerebral infarction, unspecified: I63.9

## 2014-07-17 LAB — URINALYSIS, ROUTINE W REFLEX MICROSCOPIC
Bilirubin Urine: NEGATIVE
Glucose, UA: NEGATIVE mg/dL
Hgb urine dipstick: NEGATIVE
KETONES UR: 15 mg/dL — AB
Leukocytes, UA: NEGATIVE
NITRITE: NEGATIVE
Protein, ur: NEGATIVE mg/dL
SPECIFIC GRAVITY, URINE: 1.018 (ref 1.005–1.030)
UROBILINOGEN UA: 0.2 mg/dL (ref 0.0–1.0)
pH: 5.5 (ref 5.0–8.0)

## 2014-07-17 LAB — RAPID URINE DRUG SCREEN, HOSP PERFORMED
Amphetamines: NOT DETECTED
Barbiturates: NOT DETECTED
Benzodiazepines: NOT DETECTED
COCAINE: NOT DETECTED
Opiates: NOT DETECTED
Tetrahydrocannabinol: NOT DETECTED

## 2014-07-17 LAB — CBC WITH DIFFERENTIAL/PLATELET
BASOS ABS: 0 10*3/uL (ref 0.0–0.1)
BASOS PCT: 0 % (ref 0–1)
Eosinophils Absolute: 0 10*3/uL (ref 0.0–0.7)
Eosinophils Relative: 0 % (ref 0–5)
HEMATOCRIT: 47.6 % (ref 39.0–52.0)
Hemoglobin: 17 g/dL (ref 13.0–17.0)
LYMPHS ABS: 1 10*3/uL (ref 0.7–4.0)
Lymphocytes Relative: 14 % (ref 12–46)
MCH: 34.3 pg — AB (ref 26.0–34.0)
MCHC: 35.7 g/dL (ref 30.0–36.0)
MCV: 96.2 fL (ref 78.0–100.0)
MONO ABS: 1 10*3/uL (ref 0.1–1.0)
Monocytes Relative: 13 % — ABNORMAL HIGH (ref 3–12)
Neutro Abs: 5.3 10*3/uL (ref 1.7–7.7)
Neutrophils Relative %: 73 % (ref 43–77)
Platelets: 247 10*3/uL (ref 150–400)
RBC: 4.95 MIL/uL (ref 4.22–5.81)
RDW: 13.1 % (ref 11.5–15.5)
WBC: 7.2 10*3/uL (ref 4.0–10.5)

## 2014-07-17 LAB — COMPREHENSIVE METABOLIC PANEL
ALK PHOS: 127 U/L — AB (ref 38–126)
ALT: 52 U/L (ref 17–63)
AST: 45 U/L — ABNORMAL HIGH (ref 15–41)
Albumin: 3.7 g/dL (ref 3.5–5.0)
Anion gap: 11 (ref 5–15)
BUN: 12 mg/dL (ref 6–20)
CO2: 24 mmol/L (ref 22–32)
CREATININE: 0.78 mg/dL (ref 0.61–1.24)
Calcium: 9.1 mg/dL (ref 8.9–10.3)
Chloride: 96 mmol/L — ABNORMAL LOW (ref 101–111)
GFR calc Af Amer: >60 mL/min (ref >60)
GFR calc non Af Amer: >60 mL/min (ref >60)
Glucose, Bld: 112 mg/dL — ABNORMAL HIGH (ref 65–99)
Potassium: 4.7 mmol/L (ref 3.5–5.1)
SODIUM: 131 mmol/L — AB (ref 135–145)
TOTAL PROTEIN: 8.1 g/dL (ref 6.5–8.1)
Total Bilirubin: 1.1 mg/dL (ref 0.3–1.2)

## 2014-07-17 LAB — CBG MONITORING, ED: Glucose-Capillary: 102 mg/dL — ABNORMAL HIGH (ref 65–99)

## 2014-07-17 LAB — I-STAT TROPONIN, ED: Troponin i, poc: 0.01 ng/mL (ref 0.00–0.08)

## 2014-07-17 LAB — ETHANOL

## 2014-07-17 LAB — AMMONIA: Ammonia: 26 umol/L (ref 9–35)

## 2014-07-17 MED ORDER — LORAZEPAM 1 MG PO TABS
1.0000 mg | ORAL_TABLET | Freq: Four times a day (QID) | ORAL | Status: DC | PRN
  Administered 2014-07-18: 1 mg via ORAL

## 2014-07-17 MED ORDER — LORAZEPAM 2 MG/ML IJ SOLN
INTRAMUSCULAR | Status: AC
  Administered 2014-07-17: 2 mg via INTRAVENOUS
  Filled 2014-07-17: qty 1

## 2014-07-17 MED ORDER — SODIUM CHLORIDE 0.9 % IV BOLUS (SEPSIS)
1000.0000 mL | Freq: Once | INTRAVENOUS | Status: AC
  Administered 2014-07-17: 1000 mL via INTRAVENOUS

## 2014-07-17 MED ORDER — THIAMINE HCL 100 MG/ML IJ SOLN: 100.0000 mg | Freq: Every day | INTRAMUSCULAR | Status: DC

## 2014-07-17 MED ORDER — SODIUM CHLORIDE 0.9 % IV SOLN
1000.0000 mg | Freq: Two times a day (BID) | INTRAVENOUS | Status: DC
  Administered 2014-07-17 – 2014-07-18 (×2): 1000 mg via INTRAVENOUS
  Filled 2014-07-17 (×3): qty 10

## 2014-07-17 MED ORDER — LORAZEPAM 1 MG PO TABS
0.0000 mg | ORAL_TABLET | Freq: Two times a day (BID) | ORAL | Status: DC
Start: 2014-07-19 — End: 2014-07-18

## 2014-07-17 MED ORDER — ESCITALOPRAM OXALATE 10 MG PO TABS
10.0000 mg | ORAL_TABLET | Freq: Every day | ORAL | Status: DC
  Administered 2014-07-17 – 2014-07-18 (×2): 10 mg via ORAL
  Filled 2014-07-17 (×2): qty 1

## 2014-07-17 MED ORDER — ONDANSETRON HCL 4 MG/2ML IJ SOLN: 4.0000 mg | Freq: Four times a day (QID) | INTRAMUSCULAR | Status: DC | PRN

## 2014-07-17 MED ORDER — VITAMIN B-1 100 MG PO TABS
100.0000 mg | ORAL_TABLET | Freq: Every day | ORAL | Status: DC
  Administered 2014-07-17 – 2014-07-18 (×2): 100 mg via ORAL
  Filled 2014-07-17 (×2): qty 1

## 2014-07-17 MED ORDER — ACETAMINOPHEN 650 MG RE SUPP: 650.0000 mg | Freq: Four times a day (QID) | RECTAL | Status: DC | PRN

## 2014-07-17 MED ORDER — ONDANSETRON HCL 4 MG PO TABS: 4.0000 mg | ORAL_TABLET | Freq: Four times a day (QID) | ORAL | Status: DC | PRN

## 2014-07-17 MED ORDER — LORAZEPAM 2 MG/ML IJ SOLN
2.0000 mg | Freq: Once | INTRAMUSCULAR | Status: AC
  Administered 2014-07-17: 2 mg via INTRAVENOUS

## 2014-07-17 MED ORDER — FOLIC ACID 1 MG PO TABS
1.0000 mg | ORAL_TABLET | Freq: Every day | ORAL | Status: DC
  Administered 2014-07-17 – 2014-07-18 (×2): 1 mg via ORAL
  Filled 2014-07-17 (×2): qty 1

## 2014-07-17 MED ORDER — ADULT MULTIVITAMIN W/MINERALS CH
1.0000 | ORAL_TABLET | Freq: Every day | ORAL | Status: DC
  Administered 2014-07-17 – 2014-07-18 (×2): 1 via ORAL
  Filled 2014-07-17 (×2): qty 1

## 2014-07-17 MED ORDER — LORAZEPAM 2 MG/ML IJ SOLN: 1.0000 mg | Freq: Four times a day (QID) | INTRAMUSCULAR | Status: DC | PRN

## 2014-07-17 MED ORDER — LORAZEPAM 1 MG PO TABS
0.0000 mg | ORAL_TABLET | Freq: Four times a day (QID) | ORAL | Status: DC
  Filled 2014-07-17: qty 1

## 2014-07-17 MED ORDER — ACETAMINOPHEN 325 MG PO TABS
650.0000 mg | ORAL_TABLET | Freq: Four times a day (QID) | ORAL | Status: DC | PRN
Start: 2014-07-17 — End: 2014-07-18

## 2014-07-17 MED ORDER — SODIUM CHLORIDE 0.9 % IV SOLN
INTRAVENOUS | Status: DC
  Administered 2014-07-17: 17:00:00 via INTRAVENOUS

## 2014-07-17 NOTE — H&P (Signed)
[facility] History and Physical  Filimon Miranda AVW:[id number] DOB: 19-Dec-1960 DOA: 07/17/2014  Referring physician: Emergency Department PCP: No primary care provider on file.  Specialists:   Chief Complaint: Seizures  HPI: Joshua Donaldson is a 54 y.o. male  With a hx of ETOH abuse, prior closed head injury, seizures who presents to the ED with multiple witnessed seizures. The patient [place] from [place]. Pt is currently post-ictal. From report, pt was at office of [employer] when he had witnessed seizure activity. Pt brought to ED where pt had another witnessed seizure. Pt ultimately improved with ativan.  On further questioning, pt reportedly stopped drinking ETOH 3 days prior to admission. Pt claims to be taking keppra daily  Review of Systems:  Difficult to determine given pt's post-ictal state  Past Medical History  Diagnosis Date  . Seizures   . Closed traumatic brain injury    History reviewed. No pertinent past surgical history. Social History:  reports that he has never smoked. He does not have any smokeless tobacco history on file. He reports that he does not drink alcohol or use illicit drugs.  where does patient live--home, ALF, SNF? and with whom if at home?  Can patient participate in ADLs?  No Known Allergies  No family history on file. unable to determine given post-ictal state (be sure to complete)  Prior to Admission medications   Medication Sig Start Date End Date Taking? Authorizing Provider  escitalopram (LEXAPRO) 10 MG tablet Take 10 mg by mouth daily. 07/02/14  Yes Historical Provider, MD  levETIRAcetam (KEPPRA) 500 MG tablet Take 500 mg by mouth 2 (two) times daily. 07/02/14  Yes Historical Provider, MD  PRESCRIPTION MEDICATION Take 1 tablet by mouth daily.   Yes Historical Provider, MD   Physical Exam: Filed Vitals:   07/17/14 1226 07/17/14 1440  BP: 121/69 109/79  Pulse: 87 101  Temp: 98.5 F (36.9 C)   TempSrc:  Oral   Resp: 15 21  SpO2: 94% 92%     General:  Asleep, arousable, in nad  Eyes: PERRL B  ENT: membranes moist, dentition fair  Neck: trachea midline, neck supple  Cardiovascular: regular, s1, s2  Respiratory: normal resp effort, no wheezing  Abdomen: soft,nondistended  Skin: normal skin turgor, no abnormal skin lesions seen  Musculoskeletal: perfused, no clubbing  Psychiatric: mood/affect normal// no auditory/visual hallucinations  Neurologic: cn2-12 grossly intact, strength/sesation intact  Labs on Admission:  Basic Metabolic Panel:  Recent Labs Lab 07/17/14 1317  NA 131*  K 4.7  CL 96*  CO2 24  GLUCOSE 112*  BUN 12  CREATININE 0.78  CALCIUM 9.1   Liver Function Tests:  Recent Labs Lab 07/17/14 1317  AST 45*  ALT 52  ALKPHOS 127*  BILITOT 1.1  PROT 8.1  ALBUMIN 3.7   No results for input(s): LIPASE, AMYLASE in the last 168 hours.  Recent Labs Lab 07/17/14 1317  AMMONIA 26   CBC:  Recent Labs Lab 07/17/14 1317  WBC 7.2  NEUTROABS 5.3  HGB 17.0  HCT 47.6  MCV 96.2  PLT 247   Cardiac Enzymes: No results for input(s): CKTOTAL, CKMB, CKMBINDEX, TROPONINI in the last 168 hours.  BNP (last 3 results) No results for input(s): BNP in the last 8760 hours.  ProBNP (last 3 results) No results for input(s): PROBNP in the last 8760 hours.  CBG:  Recent Labs Lab 07/17/14 1350  GLUCAP 102*    Radiological Exams on Admission: No results found.  Assessment/Plan Active Problems:   Seizures   Seizure   1. Recurent seizures 1. Pt claims to be taking keppra regularly 2. Pt seen with Neurology 3. Will order CT head w/o 4. Neurology recs to increase keppra to 1gm BID 5. Will keep NPO for now until more awake 2. ETOH abuse 1. Continue CIWA 2. Last ETOH 3 days ago 3. Hx closed head injury 1. CT head ordered 2. Seems stable 4. DVT prophylaxis 1. SCD's  Code Status: Full Family Communication: Pt in room, family at  bedside Disposition Plan: admit med-surg   Jerald KiefCHIU, Random Dobrowski K [facility] Pager [phone]  If 7PM-7AM, please contact night-coverage [email or web address] Password [email or web address] 07/17/2014, 3:27 PM

## 2014-07-17 NOTE — ED Notes (Signed)
Bed: WA13 Expected date:  Expected time:  Means of arrival:  Comments: seizures 

## 2014-07-17 NOTE — ED Notes (Signed)
Unable to void at this time.

## 2014-07-17 NOTE — Care Management Note (Signed)
Case Management Note  Patient Details  Name: Joshua Donaldson MRN: [id number] Date of Birth: 1960/12/24  Subjective/Objective:          54 yr old male (HOH- has broken hearing aids per son) [place] son  Timothy LassoZack who reports pt had seizure at the Social security office attempting to get security changed to [place] be[place]lie Dakincause he may stay CM spoke with pt who [place] no pcp.  CM discussed and provided written information for uninsured accepting pcps, discussed the importance of pcp vs EDP services for f/u care, www.needymeds.org, www.goodrx.com, discounted pharmacies and other [place] resources such as [facility] , [facility], affordable care act, financial assistance, uninsured dental services, [facility], DSS and  health department  Reviewed resources for [place] uninsured accepting pcps like [facility], family medicine at [address], [facility], [facility], local urgent care centers, [facility], [facility], general medical clinics, family services of the [facility], [facility] plus others, medication resources, [facility] out patient pharmacies and housing Pt voiced understanding and appreciation of resources provided   Provided [facility] contact information Pt not a candidate at this time Son will help to call [facility] after pt in [place] than 6 months          Action/Plan: spoke with pt and son in ED rm 13 Reviewed [place] uninsured resources   Expected Discharge Date:  July 17 2014               Expected Discharge Plan:   home with son    Discharge planning Services   home with son   Post Acute Care Choice:   pt/son Choice offered to:   pt/son    Status of Service:   completed  Additional Comments:  Ophelia ShoulderGibbs, Makenley Shimp Louise, RN 07/17/2014, 3:50 PM

## 2014-07-17 NOTE — ED Notes (Signed)
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

## 2014-07-17 NOTE — ED Provider Notes (Signed)
CSN: [id number]     Arrival date & time 07/17/14  1214 History   First MD Initiated Contact with Patient 07/17/14 1215     Chief Complaint  Patient presents with  . Seizures     (Consider location/radiation/quality/duration/timing/severity/associated sxs/prior Treatment) HPI Orlie DakinDale Raffel is a 54 y.o. male with history of closed head injury, seizures, presents to emergency department after witnessed seizure. Patient was at [employer] office when he had tonic-clonic seizure lasting several seconds. He was slow to respond after. Apparently he did not hit his head but lower down on ground. Patient was brought to emergency department for evaluation. Tongue injury. No bladder loss.  Patient has no complaints at present. He reports approximately 10 seizures with the last year. He is on Keppra. According to family, patient has history of alcohol abuse, but his son states that he has not had any alcohol in last 3 days. Patient is [place] from [place]. Compliant with medications.    Past Medical History  Diagnosis Date  . Seizures   . Closed traumatic brain injury    History reviewed. No pertinent past surgical history. No family history on file. History  Substance Use Topics  . Smoking status: Never Smoker   . Smokeless tobacco: Not on file  . Alcohol Use: No    Review of Systems  Constitutional: Negative for fever and chills.  Respiratory: Negative for cough, chest tightness and shortness of breath.   Cardiovascular: Negative for chest pain, palpitations and leg swelling.  Gastrointestinal: Negative for nausea, vomiting, abdominal pain, diarrhea and abdominal distention.  Genitourinary: Negative for dysuria, urgency, frequency and hematuria.  Musculoskeletal: Negative for myalgias, arthralgias, neck pain and neck stiffness.  Skin: Negative for rash.  Allergic/Immunologic: Negative for immunocompromised state.  Neurological: Positive for seizures.  Negative for dizziness, weakness, light-headedness, numbness and headaches.  All other systems reviewed and are negative.     Allergies  Review of patient's allergies indicates no known allergies.  Home Medications   Prior to Admission medications   Medication Sig Start Date End Date Taking? Authorizing Provider  escitalopram (LEXAPRO) 10 MG tablet Take 10 mg by mouth daily. 07/02/14  Yes Historical Provider, MD  levETIRAcetam (KEPPRA) 500 MG tablet Take 500 mg by mouth 2 (two) times daily. 07/02/14  Yes Historical Provider, MD  PRESCRIPTION MEDICATION Take 1 tablet by mouth daily.   Yes Historical Provider, MD   BP 121/69 mmHg  Pulse 87  Temp(Src) 98.5 F (36.9 C) (Oral)  Resp 15  SpO2 94% Physical Exam  Constitutional: He is oriented to person, place, and time. He appears well-developed and well-nourished. No distress.  HENT:  Head: Normocephalic and atraumatic.  Eyes: Conjunctivae are normal.  Neck: Neck supple.  Cardiovascular: Normal rate, regular rhythm and normal heart sounds.   Pulmonary/Chest: Effort normal. No respiratory distress. He has no wheezes. He has no rales.  Abdominal: Soft. Bowel sounds are normal. He exhibits no distension. There is no tenderness. There is no rebound.  Musculoskeletal: He exhibits no edema.  Neurological: He is alert and oriented to person, place, and time. No cranial nerve deficit.  5/5 and equal upper and lower extremity strength bilaterally. Equal grip strength bilaterally. Normal finger to nose and heel to shin. No pronator drift. Patellar reflexes 2+ pt had some difficulty with left grip. When asked to squeeze both hands, pt always grabs his right hand with left instead of sqeezing my fingers. There seems to be a defect in left visual  field as well   Skin: Skin is warm and dry.  Nursing note and vitals reviewed.   ED Course  Procedures (including critical care time) Labs Review Labs Reviewed  CBC WITH DIFFERENTIAL/PLATELET -  Abnormal; Notable for the following:    MCH 34.3 (*)    Monocytes Relative 13 (*)    All other components within normal limits  COMPREHENSIVE METABOLIC PANEL - Abnormal; Notable for the following:    Sodium 131 (*)    Chloride 96 (*)    Glucose, Bld 112 (*)    AST 45 (*)    Alkaline Phosphatase 127 (*)    All other components within normal limits  CBG MONITORING, ED - Abnormal; Notable for the following:    Glucose-Capillary 102 (*)    All other components within normal limits  ETHANOL  AMMONIA  URINE RAPID DRUG SCREEN, HOSP PERFORMED  URINALYSIS, ROUTINE W REFLEX MICROSCOPIC (NOT AT [facility])  I-STAT TROPOININ, ED    Imaging Review No results found.   EKG Interpretation   Date/Time:  Tuesday July 17 2014 12:24:56 EDT Ventricular Rate:  94 PR Interval:  135 QRS Duration: 82 QT Interval:  351 QTC Calculation: 439 R Axis:   -24 Text Interpretation:  Sinus rhythm Borderline left axis deviation  Confirmed by Lincoln Brigham [phone]) on 07/17/2014 2:06:36 PM      MDM   Final diagnoses:  Seizures   Pt here after episode of seizure. Also apparently hx of alcoholistm, no drinks in last 3 days. Will monitor. Vs normal. Pt possibly at baseline based on sons assessment. Will get labs.    2:24 PM Pt had another seizure lasting few seconds. Pt solmnolent after. Received  of ativan IV. VS normal.   Pt is waking up. Alert and oriented, somnolent, denies any pain. I called and spoke with [facility], will admit for observation. CIWA protocol ordered.  Filed Vitals:   07/17/14 1226 07/17/14 1440  BP: 121/69 109/79  Pulse: 87 101  Temp: 98.5 F (36.9 C)   TempSrc: Oral   Resp: 15 21  SpO2: 94% 92%     Jaynie Crumble, PA-C 07/17/14 1553  Tilden Fossa, MD 07/18/14 [phone]

## 2014-07-17 NOTE — Consult Note (Signed)
Consult Reason for Consult:seizure Referring Physician: Dr Rhona Leavens [facility]  CC: seizure  HPI: Joshua Donaldson is an 54 y.o. male with remote history of head trauma, seizure, stroke, EtOH abuse presenting to the ED after a witnessed seizure with a subsequent seizure in the ED for which he received  of ativan. Son reports patients last drink was 2-3 days ago. He was at the [employer] office today when he had a witnessed seizure lasting < 30 seconds with some post ictal confusion.   Notes approximately 10 seizures in the past year. He is on keppra  BID, reports good compliance. His son reports seizures started after head trauma in the remote past. They were well controlled until he started drinking again. CT head imaging reviewed, shows chronic right frontal infarct, no acute process.    Past Medical History  Diagnosis Date  . Seizures   . Closed traumatic brain injury   . Stroke Dec 2015  . ETOH abuse     Past Surgical History  Procedure Laterality Date  . No past surgeries      History reviewed. No pertinent family history.  Social History:  reports that he has been smoking.  His smokeless tobacco use includes Snuff and Chew. He reports that he drinks alcohol. He reports that he does not use illicit drugs.  No Known Allergies  Medications:  Scheduled: . escitalopram  10 mg Oral Daily  . folic acid  1 mg Oral Daily  . LORazepam  0-4 mg Oral Q6H   Followed by  . [START ON 07/19/2014] LORazepam  0-4 mg Oral Q12H  . multivitamin with minerals  1 tablet Oral Daily  . thiamine  100 mg Oral Daily   Or  . thiamine  100 mg Intravenous Daily    ROS: Out of a complete 14 system review, the patient complains of only the following symptoms, and all other reviewed systems are negative. +confusion  Physical Examination: Filed Vitals:   07/17/14 1440  BP: 109/79  Pulse: 101  Temp:   Resp: 21   Physical Exam  Constitutional: He appears well-developed and well-nourished.   Psych: Affect appropriate to situation Eyes: No scleral injection HENT: No OP obstrucion Head: Normocephalic.  Cardiovascular: Normal rate and regular rhythm.  Respiratory: Effort normal and breath sounds normal.  GI: Soft. Bowel sounds are normal. No distension. There is no tenderness.  Skin: WDI  Neurologic Examination Mental Status: Alert, oriented x3, some lethargy and confusion, no aphasia, no dysarthria. Follows simple commands, some difficulty with more complex commands Cranial Nerves: II: optic discs not visualized, visual fields grossly normal, pupils equal, round, reactive to light  III,IV, VI: ptosis not present, extra-ocular motions intact bilaterally V,VII: mild flattening of left NLF, facial light touch sensation normal bilaterally VIII: hearing normal bilaterally IX,X: gag reflex present XI: trapezius strength/neck flexion strength normal bilaterally XII: tongue strength normal  Motor: Right : Upper extremity    Left:     Upper extremity 5/5 deltoid       5/5 deltoid 5/5 biceps      5/5 biceps  5/5 triceps      5/5 triceps 5/5 hand grip      5/5 hand grip  Lower extremity     Lower extremity 5/5 hip flexor      5/5 hip flexor 5/5 quadricep      5/5 quadriceps  5/5 hamstrings     5/5 hamstrings 5/5 plantar flexion       5/5 plantar flexion 5/5  plantar extension     5/5 plantar extension Tone and bulk:normal tone throughout; no atrophy noted Sensory: Pinprick and light touch intact throughout, bilaterally Deep Tendon Reflexes: 2+ and symmetric throughout Plantars: Right: downgoing   Left: downgoing Cerebellar: normal finger-to-nose, and normal heel-to-shin test Gait: deferred  Laboratory Studies:   Basic Metabolic Panel:  Recent Labs Lab 07/17/14 1317  NA 131*  K 4.7  CL 96*  CO2 24  GLUCOSE 112*  BUN 12  CREATININE 0.78  CALCIUM 9.1    Liver Function Tests:  Recent Labs Lab 07/17/14 1317  AST 45*  ALT 52  ALKPHOS 127*  BILITOT 1.1   PROT 8.1  ALBUMIN 3.7   No results for input(s): LIPASE, AMYLASE in the last 168 hours.  Recent Labs Lab 07/17/14 1317  AMMONIA 26    CBC:  Recent Labs Lab 07/17/14 1317  WBC 7.2  NEUTROABS 5.3  HGB 17.0  HCT 47.6  MCV 96.2  PLT 247    Cardiac Enzymes: No results for input(s): CKTOTAL, CKMB, CKMBINDEX, TROPONINI in the last 168 hours.  BNP: Invalid input(s): POCBNP  CBG:  Recent Labs Lab 07/17/14 1350  GLUCAP 102*    Microbiology: No results found for this or any previous visit.  Coagulation Studies: No results for input(s): LABPROT, INR in the last 72 hours.  Urinalysis: No results for input(s): COLORURINE, LABSPEC, PHURINE, GLUCOSEU, HGBUR, BILIRUBINUR, KETONESUR, PROTEINUR, UROBILINOGEN, NITRITE, LEUKOCYTESUR in the last 168 hours.  Invalid input(s): APPERANCEUR  Lipid Panel:  No results found for: CHOL, TRIG, HDL, CHOLHDL, VLDL, LDLCALC  HgbA1C: No results found for: HGBA1C  Urine Drug Screen:  No results found for: LABOPIA, COCAINSCRNUR, LABBENZ, AMPHETMU, THCU, LABBARB  Alcohol Level:  Recent Labs Lab 07/17/14 1317  ETH <5    Imaging: No results found.   Assessment/Plan: 54y/o gentleman history of seizures, remote head injury, stroke and EtOH abuse presenting with breakthrough seizures. Last drink 2-3 days ago. Suspect breakthrough seizure related to EtOH withdrawal.   -increase keppra to 1000mg  BID -CIWA protocol -seizure precautions -Patient is unable to drive, operate heavy machinery, perform activities at heights or participate in water activities until release by outpatient physician.    Elspeth Choeter Aleia Larocca, DO [facility] [phone]  If 7pm- 7am, please page neurology on call as listed in AMION. 07/17/2014, 3:40 PM

## 2014-07-18 DIAGNOSIS — R569 Unspecified convulsions: Secondary | ICD-10-CM | POA: Diagnosis not present

## 2014-07-18 DIAGNOSIS — F1010 Alcohol abuse, uncomplicated: Secondary | ICD-10-CM

## 2014-07-18 LAB — CBC
HEMATOCRIT: 44.1 % (ref 39.0–52.0)
Hemoglobin: 15.6 g/dL (ref 13.0–17.0)
MCH: 34.1 pg — AB (ref 26.0–34.0)
MCHC: 35.4 g/dL (ref 30.0–36.0)
MCV: 96.5 fL (ref 78.0–100.0)
Platelets: 220 10*3/uL (ref 150–400)
RBC: 4.57 MIL/uL (ref 4.22–5.81)
RDW: 13.3 % (ref 11.5–15.5)
WBC: 7.2 10*3/uL (ref 4.0–10.5)

## 2014-07-18 LAB — COMPREHENSIVE METABOLIC PANEL
ALK PHOS: 116 U/L (ref 38–126)
ALT: 45 U/L (ref 17–63)
ANION GAP: 6 (ref 5–15)
AST: 40 U/L (ref 15–41)
Albumin: 3.3 g/dL — ABNORMAL LOW (ref 3.5–5.0)
BUN: 10 mg/dL (ref 6–20)
CHLORIDE: 99 mmol/L — AB (ref 101–111)
CO2: 28 mmol/L (ref 22–32)
Calcium: 8.6 mg/dL — ABNORMAL LOW (ref 8.9–10.3)
Creatinine, Ser: 0.73 mg/dL (ref 0.61–1.24)
GFR calc non Af Amer: >60 mL/min (ref >60)
Glucose, Bld: 99 mg/dL (ref 65–99)
Potassium: 4.3 mmol/L (ref 3.5–5.1)
SODIUM: 133 mmol/L — AB (ref 135–145)
Total Bilirubin: 1 mg/dL (ref 0.3–1.2)
Total Protein: 6.9 g/dL (ref 6.5–8.1)

## 2014-07-18 MED ORDER — LEVETIRACETAM 1000 MG PO TABS: 1000.0000 mg | ORAL_TABLET | Freq: Two times a day (BID) | ORAL | Status: AC

## 2014-07-18 NOTE — Discharge Summary (Signed)
Physician Discharge Summary  Joshua Donaldson WGN:[id number] DOB: 08-21-1960 DOA: 07/17/2014  PCP: No primary care provider on file.  Admit date: 07/17/2014 Discharge date: 07/18/2014  Recommendations for Outpatient Follow-up:  1. Pt will need to follow up with PCP in 2-3 weeks post discharge 2. Please obtain BMP to evaluate electrolytes and kidney function 3. Please also check CBC to evaluate Hg and Hct levels  Discharge Diagnoses:  Active Problems:   Seizures   Seizure  Discharge Condition: Stable  Diet recommendation: Heart healthy diet discussed in details   History of present illness:  54 y.o. male with a hx of ETOH abuse, prior closed head injury, seizures who presented to the ED with multiple witnessed seizures. The patient is from out of state visiting family. Pt was postictal on arrival to the ED. From report, pt was at office of [employer] when he had witnessed seizure activity. Pt brought to ED where pt had another witnessed seizure. Pt ultimately improved with ativan  Hospital Course:  Active Problems:   Seizures secondary to alcohol withdrawal  - continue keppra to  BID - no further seizures while inpatient  - Patient made aware he is unable to drive, operate heavy machinery, perform activities at heights or participate in water activities until release by outpatient physician.  - no further inpatient neurological workup indicated at this time. Follow up with outpatient neurologist.     Alcohol abuse - cessation consultation provided    Acute transaminitis - from alcohol use - improving     Overweight as BMI > 25 -  Body mass index is 29.86 kg/(m^2).  Procedures/Studies: Ct Head Wo Contrast  07/17/2014   CLINICAL DATA:  54 year old male with witnessed seizure today. 4 seizures this year. [place]. Initial encounter.  EXAM: CT HEAD WITHOUT CONTRAST  TECHNIQUE: Contiguous axial images were obtained from the base of  the skull through the vertex without intravenous contrast.  COMPARISON:  None.  FINDINGS: Partially visible maxillary periapical dental lucency. Chronic left maxillary sinusitis with mucoperiosteal thickening.  Other visualized paranasal sinuses and mastoids are clear. No other acute osseous abnormality.  No scalp hematoma identified. There is a superficial left lateral orbit simple fluid density lesion suggesting dermal sebaceous cyst (series 3, image 19). Smaller similar lesion overlying the left zygoma on image 10. Otherwise negative visualized orbit and face soft tissues.  Abnormal hypodensity in both anterior frontal lobes, larger and more confluent on the right. No associated mass effect. Perhaps mild ex vacuo enlargement of the frontal horns. Similar chronic encephalomalacia involving the majority of the anterior and lateral right temporal lobe. Smaller area of left lateral temporal lobe encephalomalacia (series 2, image 12). Small area of right superior frontal lobe cortical encephalomalacia on series 2, image 26. No midline shift, mass effect, or evidence of intracranial mass lesion. No ventriculomegaly. No acute intracranial hemorrhage identified. No evidence of cortically based acute infarction identified. No suspicious intracranial vascular hyperdensity.  IMPRESSION: 1. 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 The pattern favors sequelae of prior trauma (remote cerebral contusions). 2.  No acute intracranial abnormality identified. 3. Maxillary dental inflammatory disease. Superimposed chronic left maxillary sinusitis which might be secondary to the dental disease.   Electronically Signed   By: Odessa Fleming M.D.   On: 07/17/2014 16:07   Discharge Exam: Filed Vitals:   07/18/14 0425  BP: 157/98  Pulse: 62  Temp: 98.6 F (37 C)  Resp: 18   Filed Vitals:  07/17/14 1848 07/17/14 2029 07/17/14 2109 07/18/14 0425  BP: 130/87  130/80 157/98  Pulse: 71  75 62  Temp: 98.3 F (36.8  C)  98.5 F (36.9 C) 98.6 F (37 C)  TempSrc: Oral  Oral Oral  Resp: 18  18 18   Height:  5\' 7"  (1.702 m)    Weight:  86.5 kg (190 lb 11.2 oz)    SpO2: 99%  94% 100%    General: Pt is alert, follows commands appropriately, not in acute distress Cardiovascular: Regular rate and rhythm, S1/S2 +, no murmurs, no rubs, no gallops Respiratory: Clear to auscultation bilaterally, no wheezing, no crackles, no rhonchi Abdominal: Soft, non tender, non distended, bowel sounds +, no guarding  Discharge Instructions  Discharge Instructions    Diet - low sodium heart healthy    Complete by:  As directed      Increase activity slowly    Complete by:  As directed             Medication List    TAKE these medications        escitalopram 10 MG tablet  Commonly known as:  LEXAPRO  Take 10 mg by mouth daily.     levETIRAcetam 1000 MG tablet  Commonly known as:  KEPPRA  Take 1 tablet (1,000 mg total) by mouth 2 (two) times daily.     PRESCRIPTION MEDICATION  Take 1 tablet by mouth daily.           Follow-up Information    Call Debbora PrestoMAGICK-Enriqueta Augusta, MD.   Specialty:  Internal Medicine   Why:  As needed   Contact information:   [address] [place] [address] [phone]       The results of significant diagnostics from this hospitalization (including imaging, microbiology, ancillary and laboratory) are listed below for reference.     Microbiology: No results found for this or any previous visit (from the past 240 hour(s)).   Labs: Basic Metabolic Panel:  Recent Labs Lab 07/17/14 1317 07/18/14 0422  NA 131* 133*  K 4.7 4.3  CL 96* 99*  CO2 24 28  GLUCOSE 112* 99  BUN 12 10  CREATININE 0.78 0.73  CALCIUM 9.1 8.6*   Liver Function Tests:  Recent Labs Lab 07/17/14 1317 07/18/14 0422  AST 45* 40  ALT 52 45  ALKPHOS 127* 116  BILITOT 1.1 1.0  PROT 8.1 6.9  ALBUMIN 3.7 3.3*    Recent Labs Lab 07/17/14 1317  AMMONIA 26    CBC:  Recent Labs Lab 07/17/14 1317 07/18/14 0422  WBC 7.2 7.2  NEUTROABS 5.3  --   HGB 17.0 15.6  HCT 47.6 44.1  MCV 96.2 96.5  PLT 247 220   CBG:  Recent Labs Lab 07/17/14 1350  GLUCAP 102*   SIGNED: Time coordinating discharge: 30 minutes  Debbora PrestoMAGICK-Sander Remedios, MD  [facility] 07/18/2014, 10:45 AM Pager [phone]  If 7PM-7AM, please contact night-coverage [email or web address] Password [email or web address]

## 2014-07-18 NOTE — Discharge Instructions (Signed)
Nonepileptic Seizures °Nonepileptic seizures are seizures that are not caused by abnormal electrical signals in your brain. These seizures often seem like epileptic seizures, but they are not caused by epilepsy.  °There are two types of nonepileptic seizures: °· A physiologic nonepileptic seizure results from a disruption in your brain. °· A psychogenic seizure results from emotional stress. These seizures are sometimes called pseudoseizures. °CAUSES  °Causes of physiologic nonepileptic seizures include:  °· Sudden drop in blood pressure. °· Low blood sugar. °· Low levels of salt (sodium) in your blood. °· Low levels of calcium in your blood. °· Migraine. °· Heart rhythm problems. °· Sleep disorders. °· Drug and alcohol abuse. °Common causes of psychogenic nonepileptic seizures include: °· Stress. °· Emotional trauma. °· Sexual or physical abuse. °· Major life events, such as divorce or the death of a loved one. °· Mental health disorders, including panic attack and hyperactivity disorder. °SIGNS AND SYMPTOMS °A nonepileptic seizure can look like an epileptic seizure, including uncontrollable shaking (convulsions), or changes in attention, behavior, or the ability to remain awake and alert. However, there are some differences. Nonepileptic seizures usually: °· Do not cause physical injuries. °· Start slowly. °· Include crying or shrieking. °· Last longer than 2 minutes. °· Have a short recovery time without headache or exhaustion. °DIAGNOSIS  °Your health care provider can usually diagnose nonepileptic seizures after taking your medical history and giving you a physical exam. Your health care provider may want to talk to your friends or relatives who have seen you have a seizure.  °You may also need to have tests to look for causes of physiologic nonepileptic seizures. This may include an electroencephalogram (EEG), which is a test that measures electrical activity in your brain. If you have had an epileptic  seizure, the results of your EEG will be abnormal. If your health care provider thinks you have had a psychogenic nonepileptic seizure, you may need to see a mental health specialist for an evaluation. °TREATMENT  °Treatment depends on the type and cause of your seizures. °· For physiologic nonepileptic seizures, treatment is aimed at addressing the underlying condition that caused the seizures. These seizures usually stop when the underlying condition is properly treated. °· Nonepileptic seizures do not respond to the seizure medicines used to treat epilepsy. °· For psychogenic seizures, you may need to work with a mental health specialist. °HOME CARE INSTRUCTIONS °Home care will depend on the type of nonepileptic seizures you have.  °· Follow all your health care provider's instructions. °· Keep all your follow-up appointments. °SEEK MEDICAL CARE IF: °You continue to have seizures after treatment. °SEEK IMMEDIATE MEDICAL CARE IF: °· Your seizures change or become more frequent. °· You injure yourself during a seizure. °· You have one seizure after another. °· You have trouble recovering from a seizure. °· You have chest pain or trouble breathing. °MAKE SURE YOU: °· Understand these instructions. °· Will watch your condition. °· Will get help right away if you are not doing well or get worse. °Document Released: 02/06/2005 Document Revised: 05/08/2013 Document Reviewed: 10/18/2012 °Exit[employer]® Patient Information ©2015 [employer]. This information is not intended to replace advice given to you by your health care provider. Make sure you discuss any questions you have with your health care provider. ° °

## 2014-07-18 NOTE — Care Management Note (Signed)
Case Management Note  Patient Details  Name: Joshua Donaldson MRN: [id number] Date of Birth: 1960-05-04  Subjective/Objective:  Pt admitted with cco seizures                  Action/Plan:from home   Expected Discharge Date:   (unknown)               Expected Discharge Plan:  Home/Self Care  In-House Referral:     Discharge planning Services  CM Consult, Follow-up appt scheduled  Post Acute Care Choice:    Choice offered to:     DME Arranged:    DME Agency:     HH Arranged:    HH Agency:     Status of Service:     Medicare Important Message Given:    Date Medicare IM Given:    Medicare IM give by:    Date Additional Medicare IM Given:    Additional Medicare Important Message give by:     If discussed at Long Length of Stay Meetings, dates discussed:    Additional CommentsGeni Bers:  Porshe Fleagle, RN 07/18/2014, 12:02 PM

## 2014-07-18 NOTE — Progress Notes (Signed)
Subjective: Resting comfortably. No further seizure activity noted.   Objective: Current vital signs: BP 157/98 mmHg  Pulse 62  Temp(Src) 98.6 F (37 C) (Oral)  Resp 18  Ht  (1.702 m)  Wt 86.5 kg (190 lb 11.2 oz)  BMI 29.86 kg/m2  SpO2 100% Vital signs in last 24 hours: Temp:  [98.3 F (36.8 C)-98.6 F (37 C)] 98.6 F (37 C) (07/13 0425) Pulse Rate:  [62-101] 62 (07/13 0425) Resp:  [15-21] 18 (07/13 0425) BP: (109-157)/(69-98) 157/98 mmHg (07/13 0425) SpO2:  [92 %-100 %] 100 % (07/13 0425) Weight:  [86.5 kg (190 lb 11.2 oz)] 86.5 kg (190 lb 11.2 oz) (07/12 2029)  Intake/Output from previous day: 07/12 0701 - 07/13 0700 In: -  Out: 650 [Urine:650] Intake/Output this shift:   Nutritional status: Diet regular Room service appropriate?: Yes; Fluid consistency:: Thin  Neurologic Exam: Mental Status: Alert, oriented x3, , no aphasia, no dysarthria.  Cranial Nerves: II:  pupils equal, round, reactive to light  III,IV, VI: ptosis not present, extra-ocular motions intact bilaterally V,VII: mild flattening of left NLF, facial light touch sensation normal bilaterally Motor: 5/5 strength throughout Sensory: Pinprick and light touch intact throughout, bilaterally  Lab Results: Basic Metabolic Panel:  Recent Labs Lab 07/17/14 1317 07/18/14 0422  NA 131* 133*  K 4.7 4.3  CL 96* 99*  CO2 24 28  GLUCOSE 112* 99  BUN 12 10  CREATININE 0.78 0.73  CALCIUM 9.1 8.6*    Liver Function Tests:  Recent Labs Lab 07/17/14 1317 07/18/14 0422  AST 45* 40  ALT 52 45  ALKPHOS 127* 116  BILITOT 1.1 1.0  PROT 8.1 6.9  ALBUMIN 3.7 3.3*   No results for input(s): LIPASE, AMYLASE in the last 168 hours.  Recent Labs Lab 07/17/14 1317  AMMONIA 26    CBC:  Recent Labs Lab 07/17/14 1317 07/18/14 0422  WBC 7.2 7.2  NEUTROABS 5.3  --   HGB 17.0 15.6  HCT 47.6 44.1  MCV 96.2 96.5  PLT 247 220    Cardiac Enzymes: No results for input(s): CKTOTAL, CKMB,  CKMBINDEX, TROPONINI in the last 168 hours.  Lipid Panel: No results for input(s): CHOL, TRIG, HDL, CHOLHDL, VLDL, LDLCALC in the last 168 hours.  CBG:  Recent Labs Lab 07/17/14 1350  GLUCAP 102*    Microbiology: No results found for this or any previous visit.  Coagulation Studies: No results for input(s): LABPROT, INR in the last 72 hours.  Imaging: Ct Head Wo Contrast  07/17/2014   CLINICAL DATA:  54 year old male with witnessed seizure today. 4 seizures this year. [place] Initial encounter.  EXAM: CT HEAD WITHOUT CONTRAST  TECHNIQUE: Contiguous axial images were obtained from the base of the skull through the vertex without intravenous contrast.  COMPARISON:  None.  FINDINGS: Partially visible maxillary periapical dental lucency. Chronic left maxillary sinusitis with mucoperiosteal thickening.  Other visualized paranasal sinuses and mastoids are clear. No other acute osseous abnormality.  No scalp hematoma identified. There is a superficial left lateral orbit simple fluid density lesion suggesting dermal sebaceous cyst (series 3, image 19). Smaller similar lesion overlying the left zygoma on image 10. Otherwise negative visualized orbit and face soft tissues.  Abnormal hypodensity in both anterior frontal lobes, larger and more confluent on the right. No associated mass effect. Perhaps mild ex vacuo enlargement of the frontal horns. Similar chronic encephalomalacia involving the majority of the anterior and lateral right temporal lobe. Smaller area  of left lateral temporal lobe encephalomalacia (series 2, image 12). Small area of right superior frontal lobe cortical encephalomalacia on series 2, image 26. No midline shift, mass effect, or evidence of intracranial mass lesion. No ventriculomegaly. No acute intracranial hemorrhage identified. No evidence of cortically based acute infarction identified. No suspicious intracranial vascular hyperdensity.  IMPRESSION: 1.  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 The pattern favors sequelae of prior trauma (remote cerebral contusions). 2.  No acute intracranial abnormality identified. 3. Maxillary dental inflammatory disease. Superimposed chronic left maxillary sinusitis which might be secondary to the dental disease.   Electronically Signed   By: Odessa FlemingH  Hall M.D.   On: 07/17/2014 16:07    Medications:  Scheduled: . escitalopram  10 mg Oral Daily  . folic acid  1 mg Oral Daily  . levETIRAcetam  1,000 mg Intravenous Q12H  . LORazepam  0-4 mg Oral Q6H   Followed by  . [START ON 07/19/2014] LORazepam  0-4 mg Oral Q12H  . multivitamin with minerals  1 tablet Oral Daily  . thiamine  100 mg Oral Daily   Or  . thiamine  100 mg Intravenous Daily    Assessment/Plan: 54y/o gentleman history of seizures, remote head injury, stroke and EtOH abuse presenting with breakthrough seizures. Last drink 2-3 days ago. Suspect breakthrough seizure related to EtOH withdrawal.   -continue keppra to 1000mg  BID -CIWA protocol -seizure precautions -Patient is unable to drive, operate heavy machinery, perform activities at heights or participate in water activities until release by outpatient physician.  -no further inpatient neurological workup indicated at this time. Follow up with outpatient neurologist.    Elspeth ChoPeter Marrissa Dai, DO [facility] [phone]  If 7pm- 7am, please page neurology on call as listed in [facility]. 07/18/2014  9:27 AM

## 2014-07-30 ENCOUNTER — Encounter: Payer: Self-pay | Admitting: Internal Medicine

## 2014-07-30 ENCOUNTER — Ambulatory Visit: Payer: Medicare Other | Attending: Internal Medicine | Admitting: Internal Medicine

## 2014-07-30 VITALS — BP 134/89 | HR 79 | Temp 98.0°F | Resp 16 | Wt 192.8 lb

## 2014-07-30 DIAGNOSIS — R569 Unspecified convulsions: Secondary | ICD-10-CM

## 2014-07-30 DIAGNOSIS — I10 Essential (primary) hypertension: Secondary | ICD-10-CM | POA: Insufficient documentation

## 2014-07-30 DIAGNOSIS — F1010 Alcohol abuse, uncomplicated: Secondary | ICD-10-CM | POA: Diagnosis not present

## 2014-07-30 DIAGNOSIS — F17210 Nicotine dependence, cigarettes, uncomplicated: Secondary | ICD-10-CM | POA: Insufficient documentation

## 2014-07-30 DIAGNOSIS — Z720 Tobacco use: Secondary | ICD-10-CM | POA: Diagnosis not present

## 2014-07-30 DIAGNOSIS — Z1211 Encounter for screening for malignant neoplasm of colon: Secondary | ICD-10-CM

## 2014-07-30 DIAGNOSIS — Z8673 Personal history of transient ischemic attack (TIA), and cerebral infarction without residual deficits: Secondary | ICD-10-CM | POA: Insufficient documentation

## 2014-07-30 DIAGNOSIS — Z79899 Other long term (current) drug therapy: Secondary | ICD-10-CM | POA: Diagnosis not present

## 2014-07-30 DIAGNOSIS — G40909 Epilepsy, unspecified, not intractable, without status epilepticus: Secondary | ICD-10-CM | POA: Diagnosis not present

## 2014-07-30 DIAGNOSIS — F17200 Nicotine dependence, unspecified, uncomplicated: Secondary | ICD-10-CM

## 2014-07-30 MED ORDER — ENALAPRIL MALEATE 20 MG PO TABS: 20.0000 mg | ORAL_TABLET | Freq: Every day | ORAL | Status: AC

## 2014-07-30 NOTE — Patient Instructions (Addendum)
DASH Eating Plan DASH stands for "Dietary Approaches to Stop Hypertension." The DASH eating plan is a healthy eating plan that has been shown to reduce high blood pressure (hypertension). Additional health benefits may include reducing the risk of type 2 diabetes mellitus, heart disease, and stroke. The DASH eating plan may also help with weight loss. WHAT DO I NEED TO KNOW ABOUT THE DASH EATING PLAN? For the DASH eating plan, you will follow these general guidelines:  Choose foods with a percent daily value for sodium of less than 5% (as listed on the food label).  Use salt-free seasonings or herbs instead of table salt or sea salt.  Check with your health care provider or pharmacist before using salt substitutes.  Eat lower-sodium products, often labeled as "lower sodium" or "no salt added."  Eat fresh foods.  Eat more vegetables, fruits, and low-fat dairy products.  Choose whole grains. Look for the word "whole" as the first word in the ingredient list.  Choose fish and skinless chicken or turkey more often than red meat. Limit fish, poultry, and meat to 6 oz (170 g) each day.  Limit sweets, desserts, sugars, and sugary drinks.  Choose heart-healthy fats.  Limit cheese to 1 oz (28 g) per day.  Eat more home-cooked food and less restaurant, buffet, and fast food.  Limit fried foods.  Cook foods using methods other than frying.  Limit canned vegetables. If you do use them, rinse them well to decrease the sodium.  When eating at a restaurant, ask that your food be prepared with less salt, or no salt if possible. WHAT FOODS CAN I EAT? Seek help from a dietitian for individual calorie needs. Grains Whole grain or whole wheat bread. Brown rice. Whole grain or whole wheat pasta. Quinoa, bulgur, and whole grain cereals. Low-sodium cereals. Corn or whole wheat flour tortillas. Whole grain cornbread. Whole grain crackers. Low-sodium crackers. Vegetables Fresh or frozen vegetables  (raw, steamed, roasted, or grilled). Low-sodium or reduced-sodium tomato and vegetable juices. Low-sodium or reduced-sodium tomato sauce and paste. Low-sodium or reduced-sodium canned vegetables.  Fruits All fresh, canned (in natural juice), or frozen fruits. Meat and Other Protein Products Ground beef (85% or leaner), grass-fed beef, or beef trimmed of fat. Skinless chicken or turkey. Ground chicken or turkey. Pork trimmed of fat. All fish and seafood. Eggs. Dried beans, peas, or lentils. Unsalted nuts and seeds. Unsalted canned beans. Dairy Low-fat dairy products, such as skim or 1% milk, 2% or reduced-fat cheeses, low-fat ricotta or cottage cheese, or plain low-fat yogurt. Low-sodium or reduced-sodium cheeses. Fats and Oils Tub margarines without trans fats. Light or reduced-fat mayonnaise and salad dressings (reduced sodium). Avocado. Safflower, olive, or canola oils. Natural peanut or almond butter. Other Unsalted popcorn and pretzels. The items listed above may not be a complete list of recommended foods or beverages. Contact your dietitian for more options. WHAT FOODS ARE NOT RECOMMENDED? Grains White bread. White pasta. White rice. Refined cornbread. Bagels and croissants. Crackers that contain trans fat. Vegetables Creamed or fried vegetables. Vegetables in a cheese sauce. Regular canned vegetables. Regular canned tomato sauce and paste. Regular tomato and vegetable juices. Fruits Dried fruits. Canned fruit in light or heavy syrup. Fruit juice. Meat and Other Protein Products Fatty cuts of meat. Ribs, chicken wings, bacon, sausage, bologna, salami, chitterlings, fatback, hot dogs, bratwurst, and packaged luncheon meats. Salted nuts and seeds. Canned beans with salt. Dairy Whole or 2% milk, cream, half-and-half, and cream cheese. Whole-fat or sweetened yogurt. Full-fat   cheeses or blue cheese. Nondairy creamers and whipped toppings. Processed cheese, cheese spreads, or cheese  curds. Condiments Onion and garlic salt, seasoned salt, table salt, and sea salt. Canned and packaged gravies. Worcestershire sauce. Tartar sauce. Barbecue sauce. Teriyaki sauce. Soy sauce, including reduced sodium. Steak sauce. Fish sauce. Oyster sauce. Cocktail sauce. Horseradish. Ketchup and mustard. Meat flavorings and tenderizers. Bouillon cubes. Hot sauce. Tabasco sauce. Marinades. Taco seasonings. Relishes. Fats and Oils Butter, stick margarine, lard, shortening, ghee, and bacon fat. Coconut, palm kernel, or palm oils. Regular salad dressings. Other Pickles and olives. Salted popcorn and pretzels. The items listed above may not be a complete list of foods and beverages to avoid. Contact your dietitian for more information. WHERE CAN I FIND MORE INFORMATION? National Heart, Lung, and Blood Institute: www.nhlbi.nih.gov/health/health-topics/topics/dash/ Document Released: 12/11/2010 Document Revised: 05/08/2013 Document Reviewed: 10/26/2012 ExitCare Patient Information 2015 [employer]. This information is not intended to replace advice given to you by your health care provider. Make sure you discuss any questions you have with your health care provider. Smoking Cessation Quitting smoking is important to your health and has many advantages. However, it is not always easy to quit since nicotine is a very addictive drug. Oftentimes, people try 3 times or more before being able to quit. This document explains the best ways for you to prepare to quit smoking. Quitting takes hard work and a lot of effort, but you can do it. ADVANTAGES OF QUITTING SMOKING  You will live longer, feel better, and live better.  Your body will feel the impact of quitting smoking almost immediately.  Within 20 minutes, blood pressure decreases. Your pulse returns to its normal level.  After 8 hours, carbon monoxide levels in the blood return to normal. Your oxygen level increases.  After 24 hours, the chance of  having a heart attack starts to decrease. Your breath, hair, and body stop smelling like smoke.  After 48 hours, damaged nerve endings begin to recover. Your sense of taste and smell improve.  After 72 hours, the body is virtually free of nicotine. Your bronchial tubes relax and breathing becomes easier.  After 2 to 12 weeks, lungs can hold more air. Exercise becomes easier and circulation improves.  The risk of having a heart attack, stroke, cancer, or lung disease is greatly reduced.  After 1 year, the risk of coronary heart disease is cut in half.  After 5 years, the risk of stroke falls to the same as a nonsmoker.  After 10 years, the risk of lung cancer is cut in half and the risk of other cancers decreases significantly.  After 15 years, the risk of coronary heart disease drops, usually to the level of a nonsmoker.  If you are pregnant, quitting smoking will improve your chances of having a healthy baby.  The people you live with, especially any children, will be healthier.  You will have extra money to spend on things other than cigarettes. QUESTIONS TO THINK ABOUT BEFORE ATTEMPTING TO QUIT You may want to talk about your answers with your health care provider.  Why do you want to quit?  If you tried to quit in the past, what helped and what did not?  What will be the most difficult situations for you after you quit? How will you plan to handle them?  Who can help you through the tough times? Your family? Friends? A health care provider?  What pleasures do you get from smoking? What ways can you still get pleasure   if you quit? Here are some questions to ask your health care provider:  How can you help me to be successful at quitting?  What medicine do you think would be best for me and how should I take it?  What should I do if I need more help?  What is smoking withdrawal like? How can I get information on withdrawal? GET READY  Set a quit date.  Change your  environment by getting rid of all cigarettes, ashtrays, matches, and lighters in your home, car, or work. Do not let people smoke in your home.  Review your past attempts to quit. Think about what worked and what did not. GET SUPPORT AND ENCOURAGEMENT You have a better chance of being successful if you have help. You can get support in many ways.  Tell your family, friends, and coworkers that you are going to quit and need their support. Ask them not to smoke around you.  Get individual, group, or telephone counseling and support. Programs are available at local hospitals and health centers. Call your local health department for information about programs in your area.  Spiritual beliefs and practices may help some smokers quit.  Download a "quit meter" on your computer to keep track of quit statistics, such as how long you have gone without smoking, cigarettes not smoked, and money saved.  Get a self-help book about quitting smoking and staying off tobacco. LEARN NEW SKILLS AND BEHAVIORS  Distract yourself from urges to smoke. Talk to someone, go for a walk, or occupy your time with a task.  Change your normal routine. Take a different route to work. Drink tea instead of coffee. Eat breakfast in a different place.  Reduce your stress. Take a hot bath, exercise, or read a book.  Plan something enjoyable to do every day. Reward yourself for not smoking.  Explore interactive web-based programs that specialize in helping you quit. GET MEDICINE AND USE IT CORRECTLY Medicines can help you stop smoking and decrease the urge to smoke. Combining medicine with the above behavioral methods and support can greatly increase your chances of successfully quitting smoking.  Nicotine replacement therapy helps deliver nicotine to your body without the negative effects and risks of smoking. Nicotine replacement therapy includes nicotine gum, lozenges, inhalers, nasal sprays, and skin patches. Some may be  available over-the-counter and others require a prescription.  Antidepressant medicine helps people abstain from smoking, but how this works is unknown. This medicine is available by prescription.  Nicotinic receptor partial agonist medicine simulates the effect of nicotine in your brain. This medicine is available by prescription. Ask your health care provider for advice about which medicines to use and how to use them based on your health history. Your health care provider will tell you what side effects to look out for if you choose to be on a medicine or therapy. Carefully read the information on the package. Do not use any other product containing nicotine while using a nicotine replacement product.  RELAPSE OR DIFFICULT SITUATIONS Most relapses occur within the first 3 months after quitting. Do not be discouraged if you start smoking again. Remember, most people try several times before finally quitting. You may have symptoms of withdrawal because your body is used to nicotine. You may crave cigarettes, be irritable, feel very hungry, cough often, get headaches, or have difficulty concentrating. The withdrawal symptoms are only temporary. They are strongest when you first quit, but they will go away within 10-14 days. To reduce the   chances of relapse, try to:  Avoid drinking alcohol. Drinking lowers your chances of successfully quitting.  Reduce the amount of caffeine you consume. Once you quit smoking, the amount of caffeine in your body increases and can give you symptoms, such as a rapid heartbeat, sweating, and anxiety.  Avoid smokers because they can make you want to smoke.  Do not let weight gain distract you. Many smokers will gain weight when they quit, usually less than 10 pounds. Eat a healthy diet and stay active. You can always lose the weight gained after you quit.  Find ways to improve your mood other than smoking. FOR MORE INFORMATION  www.smokefree.gov  Document Released:  12/16/2000 Document Revised: 05/08/2013 Document Reviewed: 04/02/2011 ExitCare Patient Information 2015 [employer]. This information is not intended to replace advice given to you by your health care provider. Make sure you discuss any questions you have with your health care provider.  

## 2014-07-30 NOTE — Progress Notes (Signed)
Patient here for follow up from the hospital Patient states he was at the [facility] office and had a seizure Patient states he now takes keppra 1000 mg BID

## 2014-07-30 NOTE — Progress Notes (Signed)
Patient Demographics  Joshua Donaldson, is a 54 y.o. male  OZH:[id number]  GEX:[id number]  DOB - Oct 19, 1960  CC:  Chief Complaint  Patient presents with  . Seizures       HPI: Joshua Donaldson is a 54 y.o. male here today to establish medical care.Patient has history of head trauma, seizure disorder, alcohol abuse, hypertension recently hospitalized after having a seizure, EMR reviewed it was thought to be secondary to alcohol withdrawal he had a breakthrough seizure, he used to be on Keppra 500 mg twice a day which was increased to 1000 mg twice a day, head CT scan was negative for any acute findings has chronic changes, patient was advised not to drive he denies any more seizure since the discharge. As per patient he recently moved from [place] where he used to follow with the neurologist and ? Was also prescribed Lexapro by his neurologist in the past to help with the seizure. Patient denies any SI or HI. She's requesting refill on his blood pressure medication. Patient has No headache, No chest pain, No abdominal pain - No Nausea, No new weakness tingling or numbness, No Cough - SOB.  No Known Allergies Past Medical History  Diagnosis Date  . Seizures   . Closed traumatic brain injury   . Stroke Dec 2015  . ETOH abuse    Current Outpatient Prescriptions on File Prior to Visit  Medication Sig Dispense Refill  . escitalopram (LEXAPRO) 10 MG tablet Take 10 mg by mouth daily.  0  . levETIRAcetam (KEPPRA) 1000 MG tablet Take 1 tablet (1,000 mg total) by mouth 2 (two) times daily. 60 tablet 1  . PRESCRIPTION MEDICATION Take 1 tablet by mouth daily.     No current facility-administered medications on file prior to visit.   Family History  Problem Relation Age of Onset  . Diabetes Mother   . Stroke Father   . Heart disease Father   . Diabetes Brother    History   Social History  . Marital Status: Single    Spouse Name: N/A  . Number of Children: N/A  . Years of Education: N/A     Occupational History  . Not on file.   Social History Main Topics  . Smoking status: Current Every Day Smoker -- 0.50 packs/day for 39 years  . Smokeless tobacco: Current User    Types: Snuff, Chew  . Alcohol Use: 0.0 oz/week    0 Standard drinks or equivalent per week     Comment: 6 pack of beer per day  . Drug Use: No  . Sexual Activity: Not on file   Other Topics Concern  . Not on file   Social History Narrative    Review of Systems: Constitutional: Negative for fever, chills, diaphoresis, activity change, appetite change and fatigue. HENT: Negative for ear pain, nosebleeds, congestion, facial swelling, rhinorrhea, neck pain, neck stiffness and ear discharge.  Eyes: Negative for pain, discharge, redness, itching and visual disturbance. Respiratory: Negative for cough, choking, chest tightness, shortness of breath, wheezing and stridor.  Cardiovascular: Negative for chest pain, palpitations and leg swelling. Gastrointestinal: Negative for abdominal distention. Genitourinary: Negative for dysuria, urgency, frequency, hematuria, flank pain, decreased urine volume, difficulty urinating and dyspareunia.  Musculoskeletal: Negative for back pain, joint swelling, arthralgia and gait problem. Neurological: Negative for dizziness, tremors, seizures, syncope, facial asymmetry, speech difficulty, weakness, light-headedness, numbness and headaches.  Hematological: Negative for adenopathy. Does not bruise/bleed easily. Psychiatric/Behavioral: Negative for hallucinations, behavioral problems, confusion, dysphoric  mood, decreased concentration and agitation.    Objective:   Filed Vitals:   07/30/14 1414  BP: 134/89  Pulse: 79  Temp: 98 F (36.7 C)  Resp: 16    Physical Exam: Constitutional: Patient appears well-developed and well-nourished. No distress. HENT: Normocephalic, atraumatic, External right and left ear normal. Oropharynx is clear and moist.  Eyes: Conjunctivae and EOM  are normal. PERRLA, no scleral icterus. Neck: Normal ROM. Neck supple. No JVD. No tracheal deviation. No thyromegaly. CVS: RRR, S1/S2 +, no murmurs, no gallops, no carotid bruit.  Pulmonary: Effort and breath sounds normal, no stridor, rhonchi, wheezes, rales.  Abdominal: Soft. BS +, no distension, tenderness, rebound or guarding.  Musculoskeletal: Normal range of motion. No edema and no tenderness.  Neuro: Alert. Normal reflexes, muscle tone coordination. No cranial nerve deficit. Skin: Skin is warm and dry. No rash noted. Not diaphoretic. No erythema. No pallor. Psychiatric: Normal mood and affect. Behavior, judgment, thought content normal.  Lab Results  Component Value Date   WBC 7.2 07/18/2014   HGB 15.6 07/18/2014   HCT 44.1 07/18/2014   MCV 96.5 07/18/2014   PLT 220 07/18/2014   Lab Results  Component Value Date   CREATININE 0.73 07/18/2014   BUN 10 07/18/2014   NA 133* 07/18/2014   K 4.3 07/18/2014   CL 99* 07/18/2014   CO2 28 07/18/2014    No results found for: HGBA1C Lipid Panel  No results found for: CHOL, TRIG, HDL, CHOLHDL, VLDL, LDLCALC     Assessment and plan:   1. Seizures Currently patient is on Keppra 1000 mg twice a day, patient is advised not to drive until cleared by neurologist. - Ambulatory referral to Neurology  2. Essential hypertension Continue with Vasotec. - enalapril (VASOTEC) 20 MG tablet; Take 1 tablet (20 mg total) by mouth daily.  Dispense: 30 tablet; Refill: 3 - COMPLETE METABOLIC PANEL WITH GFR  3. Smoking Counseled patient to quit smoking  4. Alcohol abuse Patient is going to cut down on drinking. - COMPLETE METABOLIC PANEL WITH GFR  5. Special screening for malignant neoplasms, colon  - Ambulatory referral to Gastroenterology  Return in about 3 months (around 10/30/2014), or if symptoms worsen or fail to improve.    The patient was given clear instructions to go to ER or return to medical center if symptoms don't improve,  worsen or new problems develop. The patient verbalized understanding. The patient was told to call to get lab results if they haven't heard anything in the next week.    This note has been created with Education officer, environmental. Any transcriptional errors are unintentional.   Doris Cheadle, MD

## 2014-07-31 ENCOUNTER — Telehealth: Payer: Self-pay

## 2014-07-31 LAB — COMPLETE METABOLIC PANEL WITH GFR
ALBUMIN: 4.4 g/dL (ref 3.6–5.1)
ALT: 29 U/L (ref 9–46)
AST: 33 U/L (ref 10–35)
Alkaline Phosphatase: 106 U/L (ref 40–115)
BUN: 9 mg/dL (ref 7–25)
CO2: 28 mmol/L (ref 20–31)
Calcium: 9.7 mg/dL (ref 8.6–10.3)
Chloride: 99 mmol/L (ref 98–110)
Creat: 0.8 mg/dL (ref 0.70–1.33)
GFR, Est African American: >89 mL/min (ref >=60)
GLUCOSE: 85 mg/dL (ref 65–99)
Potassium: 4.7 mmol/L (ref 3.5–5.3)
SODIUM: 135 mmol/L (ref 135–146)
Total Bilirubin: 0.9 mg/dL (ref 0.2–1.2)
Total Protein: 7.5 g/dL (ref 6.1–8.1)

## 2014-07-31 NOTE — Telephone Encounter (Signed)
Patient not available Left message on voice mail to return our call 

## 2014-07-31 NOTE — Telephone Encounter (Signed)
-----   Message from Doris Cheadle, MD sent at 07/31/2014 10:33 AM EDT ----- Call and let the Patient know that blood work is normal.

## 2014-08-31 ENCOUNTER — Encounter: Payer: Self-pay | Admitting: Internal Medicine

## 2016-11-03 IMAGING — CT CT HEAD W/O CM
2 series · 16 of 30 positions shown, 19 images · non-contrast
Comparison: None.

CLINICAL DATA: 54-year-old male with witnessed seizure today. 4
seizures this year. [place] Initial
encounter.

EXAM:
CT HEAD WITHOUT CONTRAST
TECHNIQUE: Contiguous axial images were obtained from the base of the skull
through the vertex without intravenous contrast.

[Series 2: head w/o · axial · non-contrast · 0.44mm/px · z∈[+293,+418]mm · 9 of 33 slices shown, 12 images]
[im 4/33  brain]
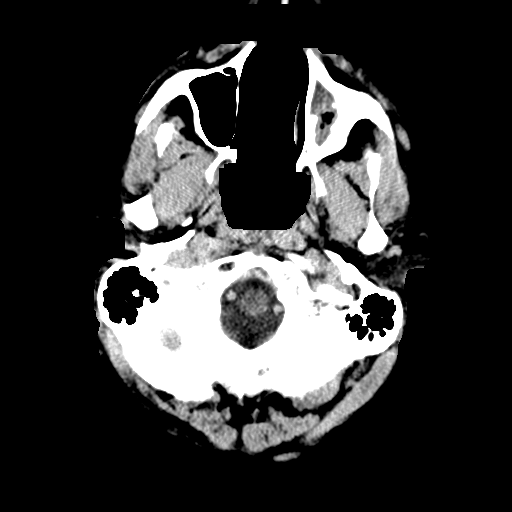
[im 4/33  bone]
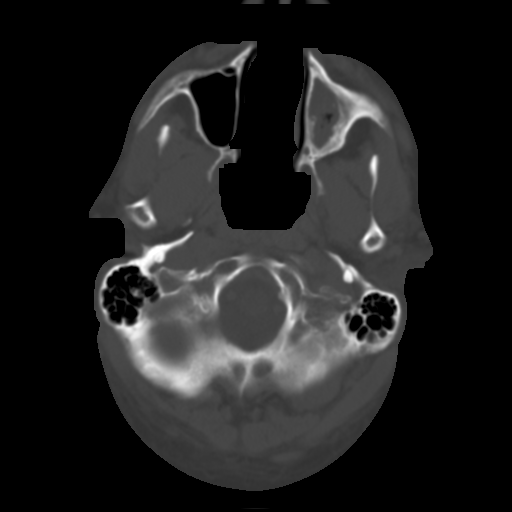
[im 7/33  brain]
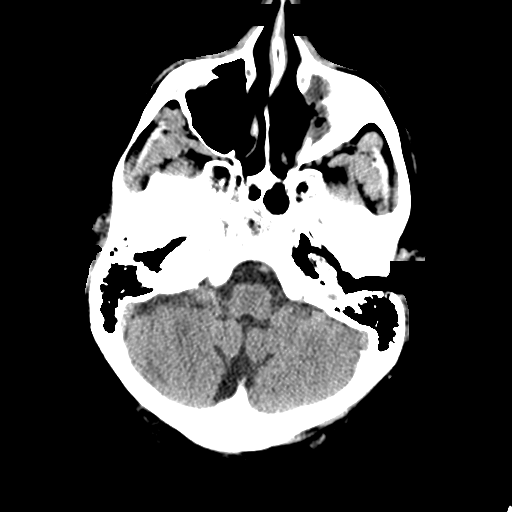
[im 10/33  brain]
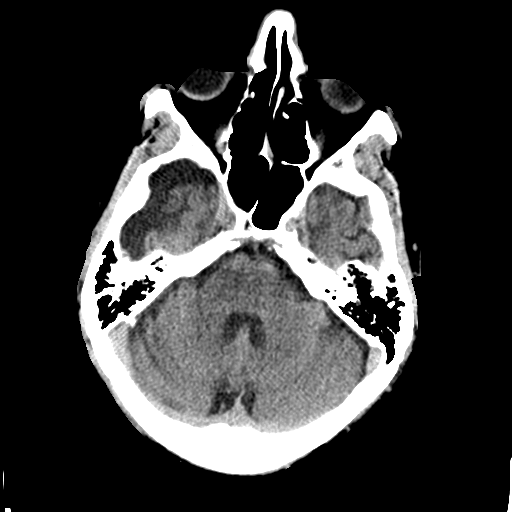
[im 13/33  brain]
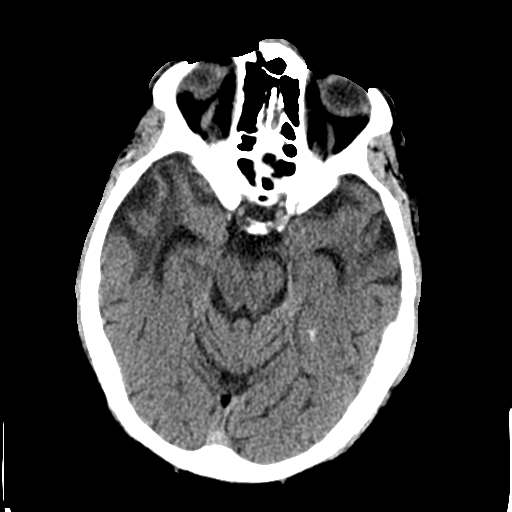
[im 17/33  brain]
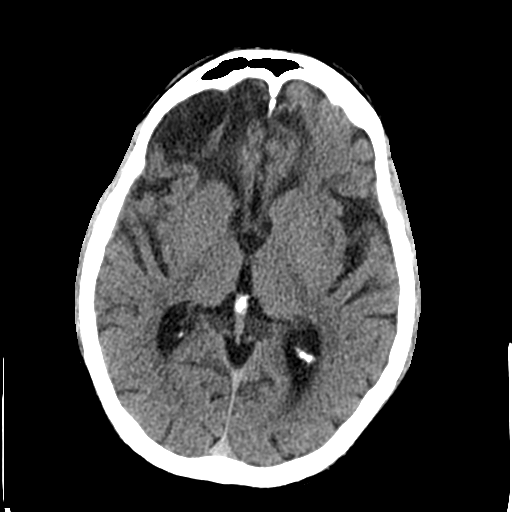
[im 17/33  bone]
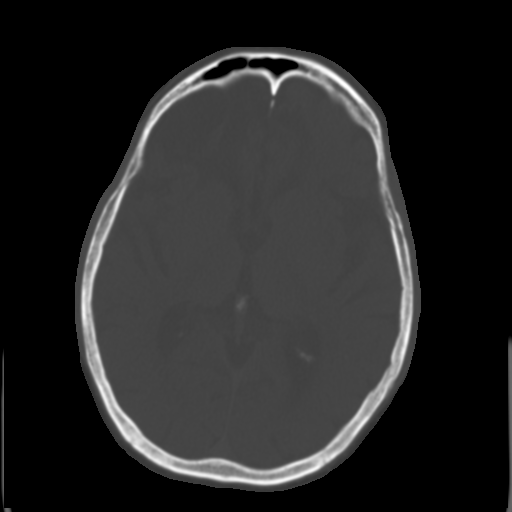
[im 20/33  brain]
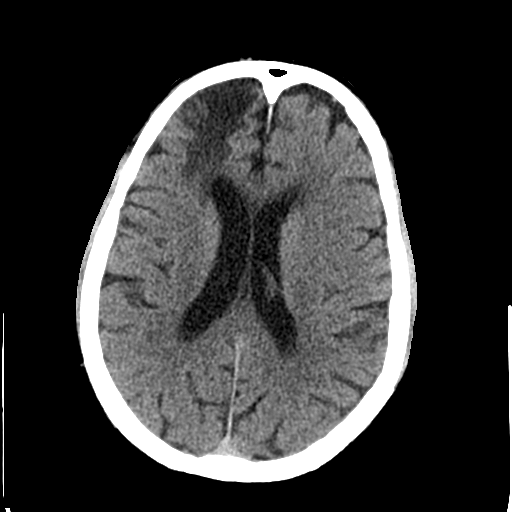
[im 23/33  brain]
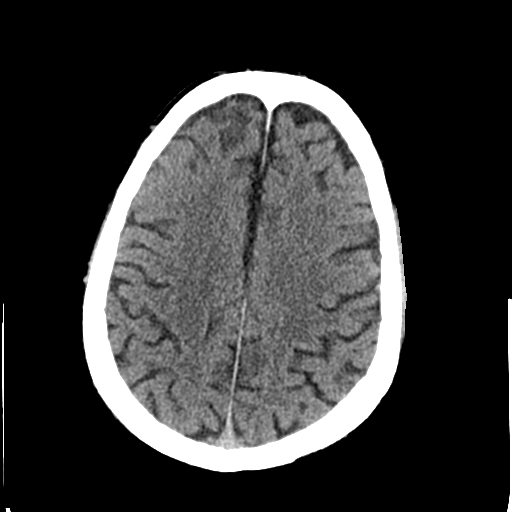
[im 26/33  brain]
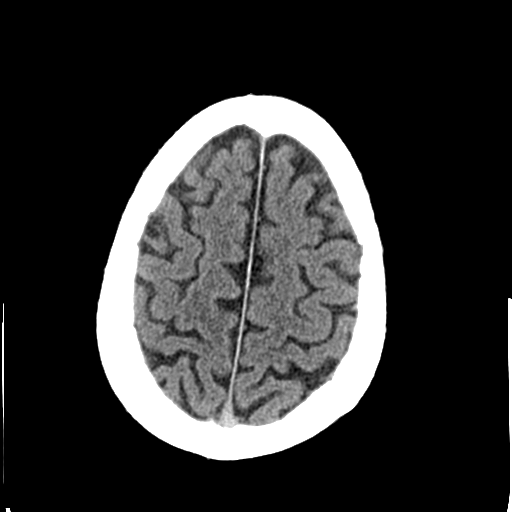
[im 29/33  brain]
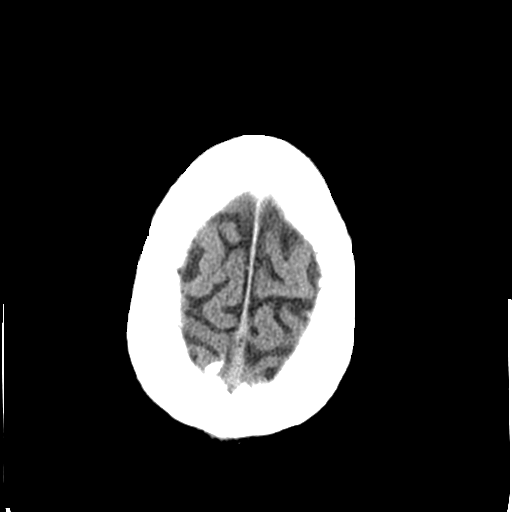
[im 29/33  bone]
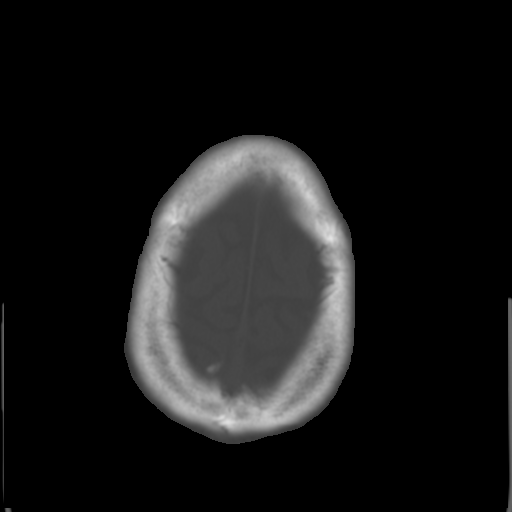

[Series 3: bone windows · axial · 0.44mm/px · z∈[+293,+401]mm · 7 of 54 slices shown]
[im 6/54  bone]
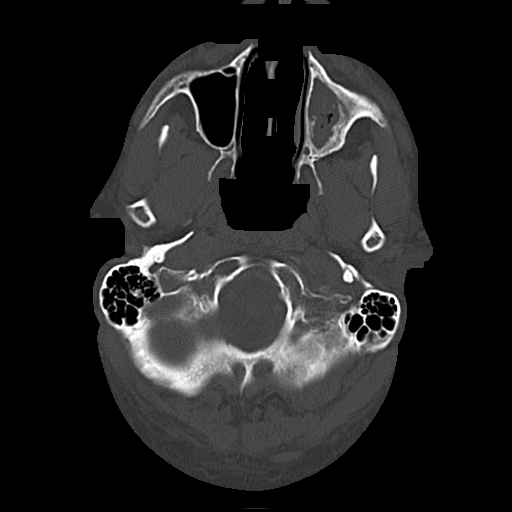
[im 12/54  bone]
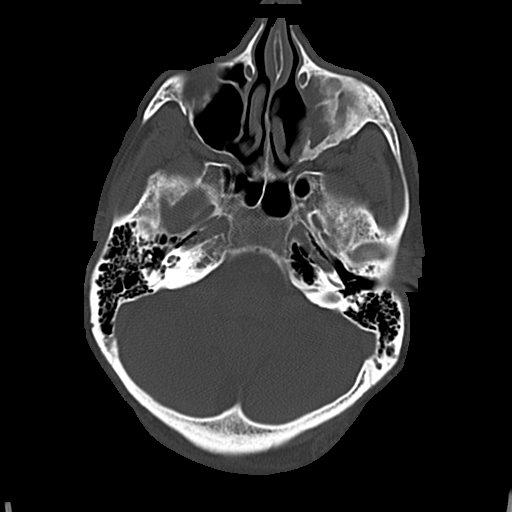
[im 18/54  bone]
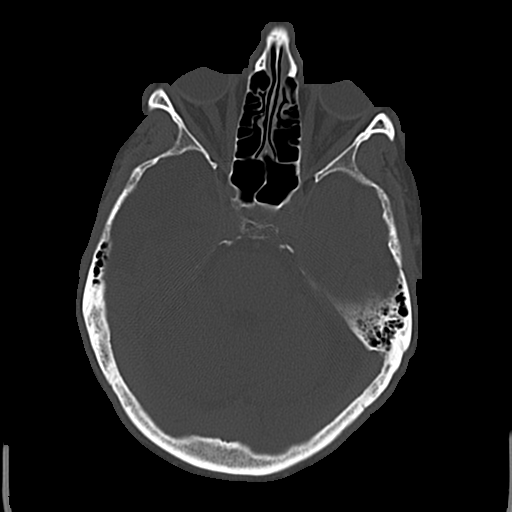
[im 24/54  bone]
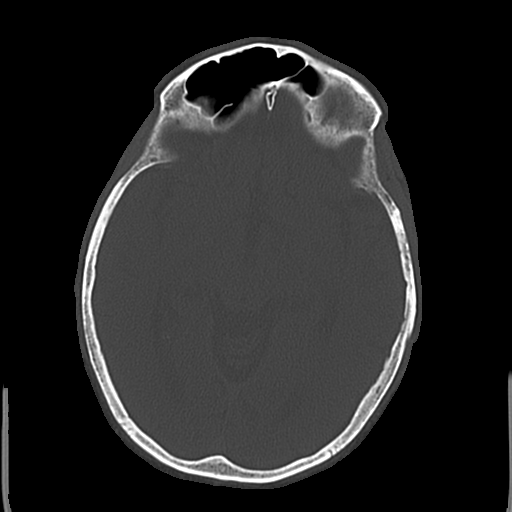
[im 30/54  bone]
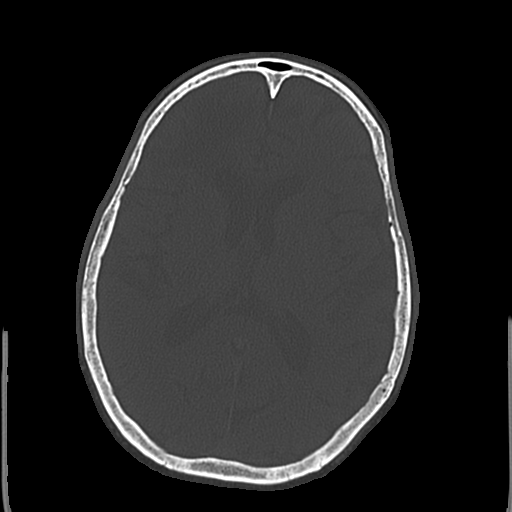
[im 36/54  bone]
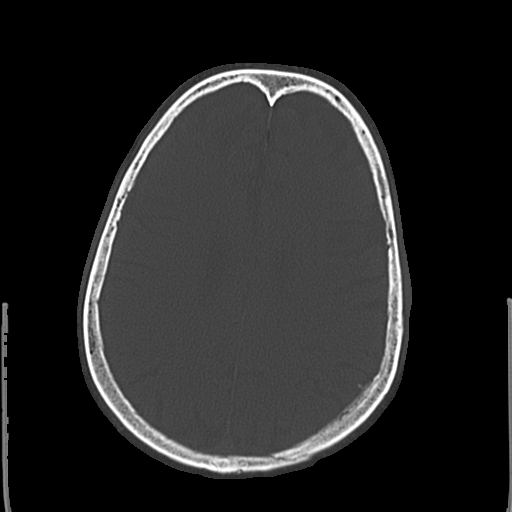
[im 42/54  bone]
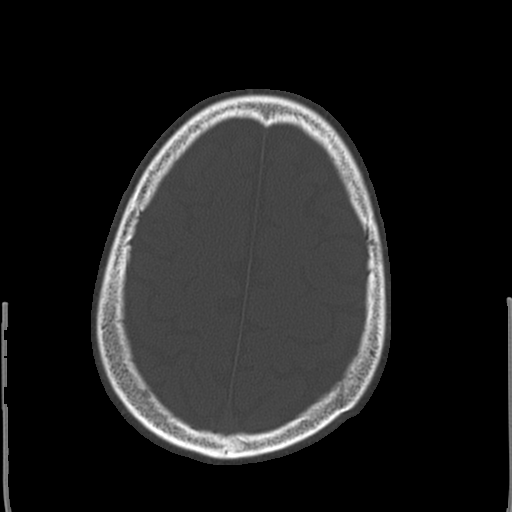

[16 of 30 positions shown; findings below may reference images not displayed]

FINDINGS: Partially visible maxillary periapical dental lucency. Chronic left
maxillary sinusitis with mucoperiosteal thickening.

Other visualized paranasal sinuses and mastoids are clear. No other
acute osseous abnormality.

No scalp hematoma identified. There is a superficial left lateral
orbit simple fluid density lesion suggesting dermal sebaceous cyst
(series 3, image 19). Smaller similar lesion overlying the left
zygoma on image 10. Otherwise negative visualized orbit and face
soft tissues.

Abnormal hypodensity in both anterior frontal lobes, larger and more
confluent on the right. No associated mass effect. Perhaps mild ex
vacuo enlargement of the frontal horns. Similar chronic
encephalomalacia involving the majority of the anterior and lateral
right temporal lobe. Smaller area of left lateral temporal lobe
encephalomalacia (series 2, image 12). Small area of right superior
frontal lobe cortical encephalomalacia on series 2, image 26. No
midline shift, mass effect, or evidence of intracranial mass lesion.
No ventriculomegaly. No acute intracranial hemorrhage identified. No
evidence of cortically based acute infarction identified. No
suspicious intracranial vascular hyperdensity.
IMPRESSION: 1. Chronic bilateral frontal and temporal lobe encephalomalacia. The
pattern favors sequelae of prior trauma (remote cerebral
contusions).
2.  No acute intracranial abnormality identified.
3. Maxillary dental inflammatory disease. Superimposed chronic left
maxillary sinusitis which might be secondary to the dental disease.
